# Patient Record
Sex: Female | Born: 1975 | Race: Black or African American | Hispanic: No | Marital: Single | Smoking: Never smoker
Health system: Southern US, Community
[De-identification: ages and names within clinical notes are randomized; demographics above are authoritative.]

## PROBLEM LIST (undated history)

## (undated) DIAGNOSIS — D649 Anemia, unspecified: Secondary | ICD-10-CM

## (undated) DIAGNOSIS — D509 Iron deficiency anemia, unspecified: Secondary | ICD-10-CM

## (undated) DIAGNOSIS — D510 Vitamin B12 deficiency anemia due to intrinsic factor deficiency: Secondary | ICD-10-CM

## (undated) DIAGNOSIS — N920 Excessive and frequent menstruation with regular cycle: Secondary | ICD-10-CM

## (undated) DIAGNOSIS — I10 Essential (primary) hypertension: Secondary | ICD-10-CM

## (undated) DIAGNOSIS — E785 Hyperlipidemia, unspecified: Secondary | ICD-10-CM

## (undated) HISTORY — DX: Excessive and frequent menstruation with regular cycle: N92.0

## (undated) HISTORY — DX: Hyperlipidemia, unspecified: E78.5

## (undated) HISTORY — DX: Vitamin B12 deficiency anemia due to intrinsic factor deficiency: D51.0

## (undated) HISTORY — DX: Iron deficiency anemia, unspecified: D50.9

## (undated) HISTORY — PX: NO PAST SURGERIES: SHX2092

---

## 2009-05-14 ENCOUNTER — Emergency Department (HOSPITAL_BASED_OUTPATIENT_CLINIC_OR_DEPARTMENT_OTHER): Admission: EM | Admit: 2009-05-14 | Discharge: 2009-05-14 | Payer: Self-pay | Admitting: Emergency Medicine

## 2010-06-30 ENCOUNTER — Inpatient Hospital Stay (HOSPITAL_COMMUNITY): Admission: AD | Admit: 2010-06-30 | Discharge: 2010-06-30 | Payer: Self-pay | Admitting: Obstetrics and Gynecology

## 2010-09-09 ENCOUNTER — Inpatient Hospital Stay (HOSPITAL_COMMUNITY)
Admission: AD | Admit: 2010-09-09 | Discharge: 2010-09-10 | Payer: Self-pay | Source: Home / Self Care | Attending: Obstetrics and Gynecology | Admitting: Obstetrics and Gynecology

## 2010-12-14 LAB — CBC
HCT: 32.7 % — ABNORMAL LOW (ref 36.0–46.0)
HCT: 37.7 % (ref 36.0–46.0)
Hemoglobin: 13.1 g/dL (ref 12.0–15.0)
MCH: 32.8 pg (ref 26.0–34.0)
MCHC: 34.7 g/dL (ref 30.0–36.0)
MCV: 94.3 fL (ref 78.0–100.0)
MCV: 95.8 fL (ref 78.0–100.0)
RDW: 12.8 % (ref 11.5–15.5)
WBC: 9.2 10*3/uL (ref 4.0–10.5)

## 2010-12-16 LAB — URINALYSIS, ROUTINE W REFLEX MICROSCOPIC
Bilirubin Urine: NEGATIVE
Hgb urine dipstick: NEGATIVE
Ketones, ur: 15 mg/dL — AB
Specific Gravity, Urine: >1.03 — ABNORMAL HIGH (ref 1.005–1.030)
pH: 6 (ref 5.0–8.0)

## 2010-12-16 LAB — URINE MICROSCOPIC-ADD ON

## 2013-01-10 ENCOUNTER — Ambulatory Visit (INDEPENDENT_AMBULATORY_CARE_PROVIDER_SITE_OTHER): Payer: Managed Care, Other (non HMO) | Admitting: General Surgery

## 2013-12-12 ENCOUNTER — Ambulatory Visit (INDEPENDENT_AMBULATORY_CARE_PROVIDER_SITE_OTHER): Payer: Managed Care, Other (non HMO) | Admitting: General Surgery

## 2013-12-12 ENCOUNTER — Encounter (INDEPENDENT_AMBULATORY_CARE_PROVIDER_SITE_OTHER): Payer: Self-pay

## 2013-12-12 ENCOUNTER — Encounter (INDEPENDENT_AMBULATORY_CARE_PROVIDER_SITE_OTHER): Payer: Self-pay | Admitting: General Surgery

## 2013-12-12 DIAGNOSIS — M25569 Pain in unspecified knee: Secondary | ICD-10-CM

## 2013-12-12 DIAGNOSIS — E785 Hyperlipidemia, unspecified: Secondary | ICD-10-CM

## 2013-12-12 HISTORY — DX: Hyperlipidemia, unspecified: E78.5

## 2013-12-12 HISTORY — DX: Pain in unspecified knee: M25.569

## 2013-12-12 HISTORY — DX: Morbid (severe) obesity due to excess calories: E66.01

## 2013-12-12 LAB — CBC WITH DIFFERENTIAL/PLATELET
BASOS ABS: 0 10*3/uL (ref 0.0–0.1)
BASOS PCT: 0 % (ref 0–1)
EOS ABS: 0.1 10*3/uL (ref 0.0–0.7)
EOS PCT: 2 % (ref 0–5)
HEMATOCRIT: 37.6 % (ref 36.0–46.0)
HEMOGLOBIN: 12.2 g/dL (ref 12.0–15.0)
Lymphocytes Relative: 34 % (ref 12–46)
Lymphs Abs: 2.1 10*3/uL (ref 0.7–4.0)
MCH: 26.8 pg (ref 26.0–34.0)
MCHC: 32.4 g/dL (ref 30.0–36.0)
MCV: 82.6 fL (ref 78.0–100.0)
MONO ABS: 0.6 10*3/uL (ref 0.1–1.0)
MONOS PCT: 9 % (ref 3–12)
Neutro Abs: 3.4 10*3/uL (ref 1.7–7.7)
Neutrophils Relative %: 55 % (ref 43–77)
Platelets: 249 10*3/uL (ref 150–400)
RBC: 4.55 MIL/uL (ref 3.87–5.11)
RDW: 17.3 % — AB (ref 11.5–15.5)
WBC: 6.2 10*3/uL (ref 4.0–10.5)

## 2013-12-12 NOTE — Progress Notes (Signed)
Subjective:   morbid obesity, dyslipidemia, joint pain  Patient ID: Rose Gonzalez, female   DOB: 1976/09/09, 38 y.o.   MRN: [id number]  HPI Patient is a pleasant 38 year old female here for consideration for surgical treatment for obesity. The patient states that she was normal weight through adolescence. She became pregnant for the first time at age 38 and gained quite a bit of weight with her pregnancy. She was able to lose a fair amount of this but she has since had 2 further pregnancies with her youngest child being 39 years old. With each pregnancy she gained more weight and in recent years has been unable to lose a significant amount of weight despite multiple attempts at supervised diets and exercise programs and use of medications. She has been on phentermine and a guide an exercise program since last fall but despite her best efforts has lost only 7 pounds. She feels that her weight is beginning to take its pole on her health. She has significant lower extremity joint pain with popping and creaking and it limits her ability to exercise. She has dyslipidemia and is on medications for this. She has researched her options carefully and into her initial information seminar. She is interested in sleeve gastrectomy. She feels the bypass is more complex and she does not like the idea of foreign materials or multiple office visits for fills required by the band.  Past Medical History  Diagnosis Date  . Hyperlipidemia   Iron deficiency anemia  History reviewed. No pertinent past surgical history. Current Outpatient Prescriptions  Medication Sig Dispense Refill  . atorvastatin (LIPITOR) 40 MG tablet        No current facility-administered medications for this visit.   No Known Allergies History  Substance Use Topics  . Smoking status: Never Smoker   . Smokeless tobacco: Not on file  . Alcohol Use: No     Review of Systems  Respiratory: Negative.   Cardiovascular: Negative.    Gastrointestinal: Positive for constipation. Negative for nausea, vomiting, abdominal pain, diarrhea and blood in stool.  Genitourinary: Positive for menstrual problem.  Musculoskeletal: Positive for arthralgias.       Objective:   Physical Exam BP 126/80  Pulse 77  Temp(Src) 97.8 F (36.6 C) (Oral)  Resp 16  Ht 5\' 11"  (1.803 m)  Wt 262 lb (118.842 kg)  BMI 36.56 kg/m2 General: Alert, Obese African American female, in no distress Skin: Warm and dry without rash or infection. HEENT: No palpable masses or thyromegaly. Sclera nonicteric. Pupils equal round and reactive. Oropharynx clear. Lymph nodes: No cervical, supraclavicular, or inguinal nodes palpable. Lungs: Breath sounds clear and equal without increased work of breathing Cardiovascular: Regular rate and rhythm without murmur. No JVD or edema. Peripheral pulses intact. Abdomen: Nondistended. Soft and nontender. No masses palpable. No organomegaly. No palpable hernias. Extremities: No edema or joint swelling or deformity. No chronic venous stasis changes. Neurologic: Alert and fully oriented. Gait normal.    Assessment:     38 year old female with progressive morbid obesity unresponsive to multiple attempts at nonsurgical management who presents with a BMI of 36.6 and comorbidities of dyslipidemia and chronic joint pain. I think she would have significant benefit with long-term weight loss. We discussed surgical options available including lap band, gastric bypass and sleeve gastrectomy. She prefers sleeping gastrectomy for the reasons above I believe she would be a good candidate for this. We discussed the procedure in detail including its nature and recovery as well as risks of  anesthetic complications, bleeding, leak and rare risk of death long-term risks of stricture, reflux, nutritional deficiencies, persistent nausea and weight regain.  She desires to proceed we'll go ahead with workup including psychologic and nutrition  evaluation, routine lab work and upper GI series. We'll see her back following these studies.  She was given a complete consent form to review and instructions for the Emmi.    Plan:     Begin workup for sleeve gastrectomy as above

## 2013-12-13 LAB — COMPREHENSIVE METABOLIC PANEL WITH GFR
ALT: 17 U/L (ref 0–35)
AST: 19 U/L (ref 0–37)
Albumin: 4.1 g/dL (ref 3.5–5.2)
Alkaline Phosphatase: 39 U/L (ref 39–117)
BUN: 9 mg/dL (ref 6–23)
CO2: 27 meq/L (ref 19–32)
Calcium: 9.3 mg/dL (ref 8.4–10.5)
Chloride: 102 meq/L (ref 96–112)
Creat: 0.86 mg/dL (ref 0.50–1.10)
Glucose, Bld: 89 mg/dL (ref 70–99)
Potassium: 4.2 meq/L (ref 3.5–5.3)
Sodium: 136 meq/L (ref 135–145)
Total Bilirubin: 0.6 mg/dL (ref 0.2–1.2)
Total Protein: 7.3 g/dL (ref 6.0–8.3)

## 2013-12-13 LAB — LIPID PANEL
Cholesterol: 185 mg/dL (ref 0–200)
HDL: 65 mg/dL (ref >39)
LDL Cholesterol: 100 mg/dL — ABNORMAL HIGH (ref 0–99)
Total CHOL/HDL Ratio: 2.8 ratio
Triglycerides: 100 mg/dL (ref <150)
VLDL: 20 mg/dL (ref 0–40)

## 2013-12-13 LAB — H. PYLORI ANTIBODY, IGG: H Pylori IgG: <0.4 {ISR}

## 2013-12-13 LAB — HCG, SERUM, QUALITATIVE: Preg, Serum: NEGATIVE

## 2014-01-03 ENCOUNTER — Other Ambulatory Visit: Payer: Self-pay

## 2014-01-03 ENCOUNTER — Ambulatory Visit (HOSPITAL_COMMUNITY)
Admission: RE | Admit: 2014-01-03 | Discharge: 2014-01-03 | Disposition: A | Discharge status: Home / Self Care | Payer: Managed Care, Other (non HMO) | Source: Ambulatory Visit | Attending: General Surgery | Admitting: General Surgery

## 2014-01-03 DIAGNOSIS — K449 Diaphragmatic hernia without obstruction or gangrene: Secondary | ICD-10-CM | POA: Insufficient documentation

## 2014-01-03 DIAGNOSIS — Z6836 Body mass index (BMI) 36.0-36.9, adult: Secondary | ICD-10-CM | POA: Insufficient documentation

## 2014-01-03 DIAGNOSIS — E785 Hyperlipidemia, unspecified: Secondary | ICD-10-CM

## 2014-01-03 DIAGNOSIS — M25569 Pain in unspecified knee: Secondary | ICD-10-CM | POA: Insufficient documentation

## 2014-01-03 DIAGNOSIS — D509 Iron deficiency anemia, unspecified: Secondary | ICD-10-CM | POA: Insufficient documentation

## 2014-01-28 ENCOUNTER — Ambulatory Visit: Payer: Self-pay | Admitting: *Deleted

## 2014-03-05 ENCOUNTER — Other Ambulatory Visit: Payer: Self-pay

## 2014-03-05 DIAGNOSIS — Z1231 Encounter for screening mammogram for malignant neoplasm of breast: Secondary | ICD-10-CM

## 2014-03-10 ENCOUNTER — Ambulatory Visit
Admission: RE | Admit: 2014-03-10 | Discharge: 2014-03-10 | Disposition: A | Discharge status: Home / Self Care | Payer: Managed Care, Other (non HMO) | Source: Ambulatory Visit

## 2014-03-10 ENCOUNTER — Encounter (INDEPENDENT_AMBULATORY_CARE_PROVIDER_SITE_OTHER): Payer: Self-pay

## 2014-03-10 DIAGNOSIS — Z1231 Encounter for screening mammogram for malignant neoplasm of breast: Secondary | ICD-10-CM

## 2014-03-20 ENCOUNTER — Encounter: Payer: Self-pay | Admitting: Dietician

## 2014-03-20 ENCOUNTER — Encounter: Payer: Managed Care, Other (non HMO) | Attending: General Surgery | Admitting: Dietician

## 2014-03-20 DIAGNOSIS — Z713 Dietary counseling and surveillance: Secondary | ICD-10-CM | POA: Insufficient documentation

## 2014-03-20 NOTE — Progress Notes (Signed)
  Pre-Op Assessment Visit:  Pre-Operative Gastric sleeve Surgery  Medical Nutrition Therapy:  Appt start time: 1145   End time:  1215.  Patient was seen on 03/20/2014 for Pre-Operative Gastric sleeve Nutrition Assessment. [facility] on 03/20/2014.   Preferred Learning Style:   No preference indicated   Learning Readiness:   Ready  Handouts given during visit include:  Pre-Op Goals Bariatric Surgery Protein Shakes  Teaching Method Utilized: Visual Auditory Hands on  Barriers to learning/adherence to lifestyle change: none  Demonstrated degree of understanding via:  Teach Back   Patient to call the [facility] to enroll in Pre-Op and Post-Op Nutrition Education when surgery date is scheduled.

## 2014-05-26 ENCOUNTER — Encounter: Payer: Managed Care, Other (non HMO) | Attending: General Surgery

## 2014-05-26 DIAGNOSIS — Z713 Dietary counseling and surveillance: Secondary | ICD-10-CM | POA: Insufficient documentation

## 2014-05-28 NOTE — Progress Notes (Signed)
  Pre-Operative Nutrition Class:  Appt start time: 1848   End time:  1830.  Patient was seen on 05/26/2014 for Pre-Operative Bariatric Surgery Education at the [facility].   Surgery date: 06/24/2014 Surgery type: Gastric sleeve Start weight at [facility]: 267 lbs on 03/20/2014 Weight today: 274.5 lbs  TANITA  BODY COMP RESULTS  05/26/14   BMI (kg/m^2) 38.3   Fat Mass (lbs) 134   Fat Free Mass (lbs) 140.5   Total Body Water (lbs) 103   Samples given per MNT protocol. Patient educated on appropriate usage: Premier protein shake (vanilla - qty 1) Lot #: [id number] Exp: 07/2014  PB2 (plain - qty 1) Lot #: [id number] Exp: 05/21/2015  Celebrate Vitamins Multivitamin (pineapple strawberry - qty 1) Lot #: [id number] Exp: 01/2015  Renee Pain Protein Powder (unflavored - qty 1) Lot #: [id number] Exp: 08/2015  The following the learning objectives were met by the patient during this course:  Identify Pre-Op Dietary Goals and will begin 2 weeks pre-operatively  Identify appropriate sources of fluids and proteins   State protein recommendations and appropriate sources pre and post-operatively  Identify Post-Operative Dietary Goals and will follow for 2 weeks post-operatively  Identify appropriate multivitamin and calcium sources  Describe the need for physical activity post-operatively and will follow MD recommendations  State when to call healthcare provider regarding medication questions or post-operative complications  Handouts given during class include:  Pre-Op Bariatric Surgery Diet Handout  Protein Shake Handout  Post-Op Bariatric Surgery Nutrition Handout  BELT Program Information Flyer  Support Group Information Flyer  [facility] Bariatric Supplements Price List  Follow-Up Plan: Patient will follow-up at [facility] 2 weeks post operatively for diet advancement per MD.

## 2014-06-10 ENCOUNTER — Encounter (HOSPITAL_COMMUNITY): Payer: Self-pay | Admitting: Pharmacy Technician

## 2014-06-13 ENCOUNTER — Encounter (HOSPITAL_COMMUNITY): Payer: Self-pay | Admitting: Pharmacy Technician

## 2014-06-13 NOTE — Progress Notes (Signed)
Please put orders in Epic surgery is 06-24-14 pre op 06-18-14 Thanks

## 2014-06-16 NOTE — Progress Notes (Signed)
Please put orders in Epic surgery 06-24-14 pre op 06-18-14 Thanks

## 2014-06-17 ENCOUNTER — Other Ambulatory Visit (INDEPENDENT_AMBULATORY_CARE_PROVIDER_SITE_OTHER): Payer: Self-pay | Admitting: General Surgery

## 2014-06-17 NOTE — Progress Notes (Signed)
Need orders in EPIC.  Surgery on 06/24/2014.  Proep on 06/18/14 at 1130am.  Thank You.

## 2014-06-17 NOTE — Patient Instructions (Addendum)
Rose Gonzalez  06/17/2014   Your procedure is scheduled on:  06/24/2014    Report to [facility].  Follow the Signs to Short Stay Center at   0915     am  Call this number if you have problems the morning of surgery: [phone]   Remember:   Do not eat food or drink liquids after midnight.   Take these medicines the morning of surgery with A SIP OF WATER: none    Do not wear jewelry, make-up or nail polish.  Do not wear lotions, powders, or perfumes.  deodorant.  Do not shave 48 hours prior to surgery.   Do not bring valuables to the hospital.  Contacts, dentures or bridgework may not be worn into surgery.  Leave suitcase in the car. After surgery it may be brought to your room.  For patients admitted to the hospital, checkout time is 11:00 AM the day of  discharge.        Please read over the following fact sheets that you were given: [facility] - Preparing for Surgery Before surgery, you can play an important role.  Because skin is not sterile, your skin needs to be as free of germs as possible.  You can reduce the number of germs on your skin by washing with CHG (chlorahexidine gluconate) soap before surgery.  CHG is an antiseptic cleaner which kills germs and bonds with the skin to continue killing germs even after washing. Please DO NOT use if you have an allergy to CHG or antibacterial soaps.  If your skin becomes reddened/irritated stop using the CHG and inform your nurse when you arrive at Short Stay. Do not shave (including legs and underarms) for at least 48 hours prior to the first CHG shower.  You may shave your face/neck. Please follow these instructions carefully:  1.  Shower with CHG Soap the night before surgery and the  morning of Surgery.  2.  If you choose to wash your hair, wash your hair first as usual with your  normal  shampoo.  3.  After you shampoo, rinse your hair and body thoroughly to remove the  shampoo.                           4.  Use CHG as  you would any other liquid soap.  You can apply chg directly  to the skin and wash                       Gently with a scrungie or clean washcloth.  5.  Apply the CHG Soap to your body ONLY FROM THE NECK DOWN.   Do not use on face/ open                           Wound or open sores. Avoid contact with eyes, ears mouth and genitals (private parts).                       Wash face,  Genitals (private parts) with your normal soap.             6.  Wash thoroughly, paying special attention to the area where your surgery  will be performed.  7.  Thoroughly rinse your body with warm water from the neck down.  8.  DO NOT shower/wash with your normal soap  after using and rinsing off  the CHG Soap.                9.  Pat yourself dry with a clean towel.            10.  Wear clean pajamas.            11.  Place clean sheets on your bed the night of your first shower and do not  sleep with pets. Day of Surgery : Do not apply any lotions/deodorants the morning of surgery.  Please wear clean clothes to the hospital/surgery center.  FAILURE TO FOLLOW THESE INSTRUCTIONS MAY RESULT IN THE CANCELLATION OF YOUR SURGERY PATIENT SIGNATURE_________________________________  NURSE SIGNATURE__________________________________  ________________________________________________________________________  coughing and deep breathing exercises, leg exercises

## 2014-06-18 ENCOUNTER — Encounter (HOSPITAL_COMMUNITY)
Admission: RE | Admit: 2014-06-18 | Discharge: 2014-06-18 | Disposition: A | Discharge status: Home / Self Care | Payer: Managed Care, Other (non HMO) | Source: Ambulatory Visit | Attending: General Surgery | Admitting: General Surgery

## 2014-06-18 ENCOUNTER — Encounter (HOSPITAL_COMMUNITY): Payer: Self-pay

## 2014-06-18 DIAGNOSIS — Z01812 Encounter for preprocedural laboratory examination: Secondary | ICD-10-CM | POA: Insufficient documentation

## 2014-06-18 DIAGNOSIS — Z6837 Body mass index (BMI) 37.0-37.9, adult: Secondary | ICD-10-CM | POA: Diagnosis not present

## 2014-06-18 HISTORY — DX: Anemia, unspecified: D64.9

## 2014-06-18 HISTORY — DX: Essential (primary) hypertension: I10

## 2014-06-18 LAB — COMPREHENSIVE METABOLIC PANEL
ALBUMIN: 3.4 g/dL — AB (ref 3.5–5.2)
ALT: 17 U/L (ref 0–35)
ANION GAP: 8 (ref 5–15)
AST: 22 U/L (ref 0–37)
Alkaline Phosphatase: 45 U/L (ref 39–117)
BUN: 11 mg/dL (ref 6–23)
CALCIUM: 9.1 mg/dL (ref 8.4–10.5)
CO2: 25 mEq/L (ref 19–32)
CREATININE: 0.93 mg/dL (ref 0.50–1.10)
Chloride: 104 mEq/L (ref 96–112)
GFR calc non Af Amer: 76 mL/min — ABNORMAL LOW (ref >90)
GFR, EST AFRICAN AMERICAN: 89 mL/min — AB (ref >90)
GLUCOSE: 105 mg/dL — AB (ref 70–99)
Potassium: 3.8 mEq/L (ref 3.7–5.3)
Sodium: 137 mEq/L (ref 137–147)
TOTAL PROTEIN: 7.2 g/dL (ref 6.0–8.3)
Total Bilirubin: 0.4 mg/dL (ref 0.3–1.2)

## 2014-06-18 LAB — CBC WITH DIFFERENTIAL/PLATELET
BASOS PCT: 0 % (ref 0–1)
Basophils Absolute: 0 10*3/uL (ref 0.0–0.1)
EOS ABS: 0.1 10*3/uL (ref 0.0–0.7)
EOS PCT: 3 % (ref 0–5)
HCT: 32.6 % — ABNORMAL LOW (ref 36.0–46.0)
HEMOGLOBIN: 10.5 g/dL — AB (ref 12.0–15.0)
LYMPHS ABS: 1.4 10*3/uL (ref 0.7–4.0)
Lymphocytes Relative: 33 % (ref 12–46)
MCH: 27 pg (ref 26.0–34.0)
MCHC: 32.2 g/dL (ref 30.0–36.0)
MCV: 83.8 fL (ref 78.0–100.0)
MONOS PCT: 9 % (ref 3–12)
Monocytes Absolute: 0.4 10*3/uL (ref 0.1–1.0)
NEUTROS PCT: 55 % (ref 43–77)
Neutro Abs: 2.2 10*3/uL (ref 1.7–7.7)
PLATELETS: 243 10*3/uL (ref 150–400)
RBC: 3.89 MIL/uL (ref 3.87–5.11)
RDW: 13.8 % (ref 11.5–15.5)
WBC: 4.1 10*3/uL (ref 4.0–10.5)

## 2014-06-18 LAB — HCG, SERUM, QUALITATIVE: PREG SERUM: NEGATIVE

## 2014-06-18 NOTE — Progress Notes (Signed)
CBC faxed via EPIC to Dr Johna Sheriff.

## 2014-06-20 ENCOUNTER — Other Ambulatory Visit (INDEPENDENT_AMBULATORY_CARE_PROVIDER_SITE_OTHER): Payer: Self-pay | Admitting: General Surgery

## 2014-06-20 NOTE — H&P (Signed)
History of Present Illness Rose Salina T. Ludie Pavlik MD; 06/20/2014 11:06 AM) Patient words: Pre-Op Gastric Sleeve.  The patient is a 38 year old female who presents with obesity. Patient returns for her preop visit prior to planned laparoscopic sleeve gastrectomy forprogressive morbid obesity with comorbidities of dyslipidemia and chronic joint pain. We reviewed her preoperative workup.There were no significant concerns on her lab work or psych and nutrition evaluations.Upper GI series showed a "tiny" hiatal herniaand I told her we would look for thisduring surgery. She has been feeling well with nointercurrent illness or current infection sore other complaints. Other Problems Rose Gonzalez, [place]; 06/20/2014 10:17 AM) High blood pressure  Past Surgical History Rose Gonzalez, [place]; 06/20/2014 10:17 AM) No pertinent past surgical history  Diagnostic Studies History Rose Gonzalez, [place]; 06/20/2014 10:17 AM) Colonoscopy never Mammogram within last year Pap Smear 1-5 years ago  Allergies Rose Gonzalez, [place]; 06/20/2014 10:17 AM) No Known Drug Allergies 06/20/2014  Social History Rose Gonzalez, [place]; 06/20/2014 10:17 AM) Alcohol use Occasional alcohol use. Caffeine use Carbonated beverages, Coffee, Tea. No drug use Tobacco use Never smoker.  Family History Rose Gonzalez, [place]; 06/20/2014 10:17 AM) Depression Mother. Heart Disease Father.  Pregnancy / Birth History Rose Gonzalez, [place]; 06/20/2014 10:17 AM) Age at menarche 9 years. Contraceptive History Depo-provera, Intrauterine device, Oral contraceptives. Gravida 3 Maternal age 1-20 Para 3 Regular periods     Review of Systems Rose Gonzalez [place]; 06/20/2014 10:17 AM) General Not Present- Appetite Loss, Chills, Fatigue, Fever, Night Sweats, Weight Gain and Weight Loss. Skin Not Present- Change in Wart/Mole, Dryness, Hives, Jaundice, New Lesions, Non-Healing Wounds, Rash and Ulcer. HEENT Not Present- Earache, Hearing Loss,  Hoarseness, Nose Bleed, Oral Ulcers, Ringing in the Ears, Seasonal Allergies, Sinus Pain, Sore Throat, Visual Disturbances, Wears glasses/contact lenses and Yellow Eyes. Respiratory Not Present- Bloody sputum, Chronic Cough, Difficulty Breathing, Snoring and Wheezing. Breast Not Present- Breast Mass, Breast Pain, Nipple Discharge and Skin Changes. Cardiovascular Not Present- Chest Pain, Difficulty Breathing Lying Down, Leg Cramps, Palpitations, Rapid Heart Rate, Shortness of Breath and Swelling of Extremities. Gastrointestinal Not Present- Abdominal Pain, Bloating, Bloody Stool, Change in Bowel Habits, Chronic diarrhea, Constipation, Difficulty Swallowing, Excessive gas, Gets full quickly at meals, Hemorrhoids, Indigestion, Nausea, Rectal Pain and Vomiting. Female Genitourinary Not Present- Frequency, Nocturia, Painful Urination, Pelvic Pain and Urgency. Musculoskeletal Not Present- Back Pain, Joint Pain, Joint Stiffness, Muscle Pain, Muscle Weakness and Swelling of Extremities. Neurological Not Present- Decreased Memory, Fainting, Headaches, Numbness, Seizures, Tingling, Tremor, Trouble walking and Weakness. Psychiatric Not Present- Anxiety, Bipolar, Change in Sleep Pattern, Depression, Fearful and Frequent crying. Endocrine Not Present- Cold Intolerance, Excessive Hunger, Hair Changes, Heat Intolerance, Hot flashes and New Diabetes. Hematology Not Present- Easy Bruising, Excessive bleeding, Gland problems, HIV and Persistent Infections.  Vitals Rose Puls MA; 06/20/2014 10:18 AM) 06/20/2014 10:17 AM Weight: 273.2 lb Height: 71in Body Surface Area: 2.49 m Body Mass Index: 38.1 kg/m Temp.: 65F(Temporal)  Pulse: 80 (Regular)  Resp.: 16 (Unlabored)  BP: 128/84 (Sitting, Left Arm, Standard) Pre-Op Gastric Sleeve weight: 273.2 Pre-Op Gastric Sleeve BMI: 38.20    Physical Exam Rose Salina T. [name] MD; 06/20/2014 11:07 AM)  General Mental Status-Alert. General  Appearance-Consistent with stated age. Hydration-Well hydrated. Voice-Normal.  Head and Neck Note: No palpable masses. Sclera nonicteric. No thyromegaly.   Chest and Lung Exam Note: Clear equal breath sounds bilaterally without wheezing or increased work of breathing   Cardiovascular Note: regular rate and rhythm. No murmurs. No edema.   Abdomen Note: Soft and nontender. No  masses or organomegaly or hernias.   Musculoskeletal Note: No joint swelling or calf tenderness or swelling     Assessment & Plan Rose Salina T. Kysen Wetherington MD; 06/20/2014 11:10 AM)  MORBID OBESITY (278.01  E66.01) Impression: ready to proceed with laparoscopicsleeve gastrectomy.We reviewed the procedure and recovery again in detailand reviewed the consent form and all her questions were answered.She is given a prescription for pain medication.  Current Plans Started OxyCODONE HCl /5ML, 5-10 Milliliter every four hours, as needed, 200 Milliliter, 06/20/2014, No Refill.

## 2014-06-24 ENCOUNTER — Encounter (HOSPITAL_COMMUNITY): Payer: Managed Care, Other (non HMO) | Admitting: Anesthesiology

## 2014-06-24 ENCOUNTER — Encounter (HOSPITAL_COMMUNITY)
Admission: RE | Disposition: A | Discharge status: Home / Self Care | Payer: Self-pay | Source: Ambulatory Visit | Attending: General Surgery

## 2014-06-24 ENCOUNTER — Encounter (HOSPITAL_COMMUNITY): Payer: Self-pay

## 2014-06-24 ENCOUNTER — Inpatient Hospital Stay (HOSPITAL_COMMUNITY): Payer: Managed Care, Other (non HMO) | Admitting: Anesthesiology

## 2014-06-24 ENCOUNTER — Inpatient Hospital Stay (HOSPITAL_COMMUNITY)
Admission: RE | Admit: 2014-06-24 | Discharge: 2014-06-26 | DRG: 621 | Disposition: A | Discharge status: Home / Self Care | Payer: Managed Care, Other (non HMO) | Source: Ambulatory Visit | Attending: General Surgery | Admitting: General Surgery

## 2014-06-24 DIAGNOSIS — Z01812 Encounter for preprocedural laboratory examination: Secondary | ICD-10-CM | POA: Diagnosis not present

## 2014-06-24 DIAGNOSIS — Z8249 Family history of ischemic heart disease and other diseases of the circulatory system: Secondary | ICD-10-CM | POA: Diagnosis not present

## 2014-06-24 DIAGNOSIS — E785 Hyperlipidemia, unspecified: Secondary | ICD-10-CM | POA: Diagnosis present

## 2014-06-24 DIAGNOSIS — Z6838 Body mass index (BMI) 38.0-38.9, adult: Secondary | ICD-10-CM | POA: Diagnosis not present

## 2014-06-24 DIAGNOSIS — G8929 Other chronic pain: Secondary | ICD-10-CM | POA: Diagnosis present

## 2014-06-24 DIAGNOSIS — K449 Diaphragmatic hernia without obstruction or gangrene: Secondary | ICD-10-CM | POA: Diagnosis present

## 2014-06-24 HISTORY — PX: LAPAROSCOPIC GASTRIC SLEEVE RESECTION: SHX5895

## 2014-06-24 LAB — CBC WITH DIFFERENTIAL/PLATELET
BASOS ABS: 0 10*3/uL (ref 0.0–0.1)
BASOS PCT: 0 % (ref 0–1)
Eosinophils Absolute: 0.1 10*3/uL (ref 0.0–0.7)
Eosinophils Relative: 3 % (ref 0–5)
HEMATOCRIT: 31.8 % — AB (ref 36.0–46.0)
Hemoglobin: 10.3 g/dL — ABNORMAL LOW (ref 12.0–15.0)
LYMPHS PCT: 36 % (ref 12–46)
Lymphs Abs: 1.5 10*3/uL (ref 0.7–4.0)
MCH: 27.2 pg (ref 26.0–34.0)
MCHC: 32.4 g/dL (ref 30.0–36.0)
MCV: 83.9 fL (ref 78.0–100.0)
MONO ABS: 0.4 10*3/uL (ref 0.1–1.0)
Monocytes Relative: 9 % (ref 3–12)
NEUTROS ABS: 2.2 10*3/uL (ref 1.7–7.7)
NEUTROS PCT: 52 % (ref 43–77)
PLATELETS: 195 10*3/uL (ref 150–400)
RBC: 3.79 MIL/uL — ABNORMAL LOW (ref 3.87–5.11)
RDW: 14.1 % (ref 11.5–15.5)
WBC: 4.2 10*3/uL (ref 4.0–10.5)

## 2014-06-24 LAB — COMPREHENSIVE METABOLIC PANEL
ALT: 14 U/L (ref 0–35)
AST: 19 U/L (ref 0–37)
Albumin: 3.4 g/dL — ABNORMAL LOW (ref 3.5–5.2)
Alkaline Phosphatase: 40 U/L (ref 39–117)
Anion gap: 9 (ref 5–15)
BILIRUBIN TOTAL: 0.4 mg/dL (ref 0.3–1.2)
BUN: 8 mg/dL (ref 6–23)
CALCIUM: 8.9 mg/dL (ref 8.4–10.5)
CHLORIDE: 103 meq/L (ref 96–112)
CO2: 26 mEq/L (ref 19–32)
Creatinine, Ser: 0.97 mg/dL (ref 0.50–1.10)
GFR calc Af Amer: 84 mL/min — ABNORMAL LOW (ref >90)
GFR calc non Af Amer: 73 mL/min — ABNORMAL LOW (ref >90)
Glucose, Bld: 97 mg/dL (ref 70–99)
Potassium: 3.8 mEq/L (ref 3.7–5.3)
SODIUM: 138 meq/L (ref 137–147)
Total Protein: 6.8 g/dL (ref 6.0–8.3)

## 2014-06-24 LAB — HEMOGLOBIN AND HEMATOCRIT, BLOOD
HCT: 33.5 % — ABNORMAL LOW (ref 36.0–46.0)
Hemoglobin: 10.6 g/dL — ABNORMAL LOW (ref 12.0–15.0)

## 2014-06-24 LAB — PREGNANCY, URINE: Preg Test, Ur: NEGATIVE

## 2014-06-24 SURGERY — GASTRECTOMY, SLEEVE, LAPAROSCOPIC
Anesthesia: General | Site: Abdomen

## 2014-06-24 MED ORDER — CEFOXITIN SODIUM 2 G IV SOLR: 2.0000 g | INTRAVENOUS | Status: DC

## 2014-06-24 MED ORDER — PROPOFOL 10 MG/ML IV BOLUS
INTRAVENOUS | Status: DC | PRN
  Administered 2014-06-24: 150 mg via INTRAVENOUS

## 2014-06-24 MED ORDER — TISSEEL VH 10 ML EX KIT
PACK | CUTANEOUS | Status: DC | PRN
  Administered 2014-06-24: 20 mL

## 2014-06-24 MED ORDER — TISSEEL VH 10 ML EX KIT
PACK | CUTANEOUS | Status: AC
  Filled 2014-06-24: qty 2

## 2014-06-24 MED ORDER — DEXTROSE 5 % IV SOLN
INTRAVENOUS | Status: AC
  Filled 2014-06-24: qty 2

## 2014-06-24 MED ORDER — BUPIVACAINE-EPINEPHRINE 0.25% -1:200000 IJ SOLN
INTRAMUSCULAR | Status: DC | PRN
  Administered 2014-06-24: 33 mL

## 2014-06-24 MED ORDER — DEXTROSE 5 % IV SOLN
2.0000 g | INTRAVENOUS | Status: AC
  Administered 2014-06-24: 2 g via INTRAVENOUS

## 2014-06-24 MED ORDER — FENTANYL CITRATE 0.05 MG/ML IJ SOLN
INTRAMUSCULAR | Status: AC
  Filled 2014-06-24: qty 5

## 2014-06-24 MED ORDER — KETAMINE HCL 10 MG/ML IJ SOLN
INTRAMUSCULAR | Status: DC | PRN
  Administered 2014-06-24: 30 mg via INTRAVENOUS

## 2014-06-24 MED ORDER — DEXMEDETOMIDINE HCL 200 MCG/2ML IV SOLN
INTRAVENOUS | Status: DC | PRN
  Administered 2014-06-24: 8 ug via INTRAVENOUS
  Administered 2014-06-24: 10 ug via INTRAVENOUS
  Administered 2014-06-24: 6 ug via INTRAVENOUS
  Administered 2014-06-24: 8 ug via INTRAVENOUS
  Administered 2014-06-24: 10 ug via INTRAVENOUS

## 2014-06-24 MED ORDER — HEPARIN SODIUM (PORCINE) 5000 UNIT/ML IJ SOLN
5000.0000 [IU] | INTRAMUSCULAR | Status: DC
Start: 2014-06-24 — End: 2014-06-24

## 2014-06-24 MED ORDER — ONDANSETRON HCL 4 MG/2ML IJ SOLN
4.0000 mg | INTRAMUSCULAR | Status: DC | PRN
  Administered 2014-06-24 (×2): 4 mg via INTRAVENOUS
  Filled 2014-06-24 (×2): qty 2

## 2014-06-24 MED ORDER — DEXAMETHASONE SODIUM PHOSPHATE 10 MG/ML IJ SOLN
INTRAMUSCULAR | Status: AC
  Filled 2014-06-24: qty 1

## 2014-06-24 MED ORDER — HEPARIN SODIUM (PORCINE) 5000 UNIT/ML IJ SOLN
5000.0000 [IU] | Freq: Three times a day (TID) | INTRAMUSCULAR | Status: DC
Start: 2014-06-24 — End: 2014-06-26
  Administered 2014-06-24 – 2014-06-26 (×5): 5000 [IU] via SUBCUTANEOUS
  Filled 2014-06-24 (×8): qty 1

## 2014-06-24 MED ORDER — NEOSTIGMINE METHYLSULFATE 10 MG/10ML IV SOLN
INTRAVENOUS | Status: DC | PRN
  Administered 2014-06-24: 4 mg via INTRAVENOUS

## 2014-06-24 MED ORDER — DEXAMETHASONE SODIUM PHOSPHATE 10 MG/ML IJ SOLN
INTRAMUSCULAR | Status: DC | PRN
  Administered 2014-06-24: 10 mg via INTRAVENOUS

## 2014-06-24 MED ORDER — ACETAMINOPHEN 160 MG/5ML PO SOLN: 325.0000 mg | ORAL | Status: DC | PRN

## 2014-06-24 MED ORDER — CHLORHEXIDINE GLUCONATE CLOTH 2 % EX PADS: 6.0000 | MEDICATED_PAD | Freq: Once | CUTANEOUS | Status: DC

## 2014-06-24 MED ORDER — LIDOCAINE HCL (CARDIAC) 20 MG/ML IV SOLN
INTRAVENOUS | Status: AC
  Filled 2014-06-24: qty 5

## 2014-06-24 MED ORDER — FENTANYL CITRATE 0.05 MG/ML IJ SOLN
INTRAMUSCULAR | Status: DC | PRN
  Administered 2014-06-24: 25 ug via INTRAVENOUS
  Administered 2014-06-24: 50 ug via INTRAVENOUS
  Administered 2014-06-24: 25 ug via INTRAVENOUS
  Administered 2014-06-24: 50 ug via INTRAVENOUS
  Administered 2014-06-24 (×2): 25 ug via INTRAVENOUS
  Administered 2014-06-24: 50 ug via INTRAVENOUS

## 2014-06-24 MED ORDER — GLYCOPYRROLATE 0.2 MG/ML IJ SOLN
INTRAMUSCULAR | Status: DC | PRN
  Administered 2014-06-24: 0.6 mg via INTRAVENOUS

## 2014-06-24 MED ORDER — GLYCOPYRROLATE 0.2 MG/ML IJ SOLN
INTRAMUSCULAR | Status: AC
  Filled 2014-06-24: qty 3

## 2014-06-24 MED ORDER — FENTANYL CITRATE 0.05 MG/ML IJ SOLN: 25.0000 ug | INTRAMUSCULAR | Status: DC | PRN

## 2014-06-24 MED ORDER — KCL IN DEXTROSE-NACL 20-5-0.9 MEQ/L-%-% IV SOLN
INTRAVENOUS | Status: DC
  Administered 2014-06-24 – 2014-06-26 (×4): via INTRAVENOUS
  Filled 2014-06-24 (×6): qty 1000

## 2014-06-24 MED ORDER — MIDAZOLAM HCL 2 MG/2ML IJ SOLN
INTRAMUSCULAR | Status: AC
  Filled 2014-06-24: qty 2

## 2014-06-24 MED ORDER — 0.9 % SODIUM CHLORIDE (POUR BTL) OPTIME
TOPICAL | Status: DC | PRN
  Administered 2014-06-24: 1000 mL

## 2014-06-24 MED ORDER — PROMETHAZINE HCL 25 MG/ML IJ SOLN: 6.2500 mg | INTRAMUSCULAR | Status: DC | PRN

## 2014-06-24 MED ORDER — MORPHINE SULFATE 2 MG/ML IJ SOLN
2.0000 mg | INTRAMUSCULAR | Status: DC | PRN
  Administered 2014-06-24: 2 mg via INTRAVENOUS
  Filled 2014-06-24: qty 1

## 2014-06-24 MED ORDER — ACETAMINOPHEN 160 MG/5ML PO SOLN: 650.0000 mg | ORAL | Status: DC | PRN

## 2014-06-24 MED ORDER — UNJURY CHOCOLATE CLASSIC POWDER
2.0000 [oz_av] | Freq: Four times a day (QID) | ORAL | Status: DC
Start: 2014-06-26 — End: 2014-06-26

## 2014-06-24 MED ORDER — LACTATED RINGERS IR SOLN
Status: DC | PRN
  Administered 2014-06-24: 1000 mL

## 2014-06-24 MED ORDER — INFLUENZA VAC SPLIT QUAD 0.5 ML IM SUSY: 0.5000 mL | PREFILLED_SYRINGE | INTRAMUSCULAR | Status: DC

## 2014-06-24 MED ORDER — KETAMINE HCL 10 MG/ML IJ SOLN
INTRAMUSCULAR | Status: AC
  Filled 2014-06-24: qty 1

## 2014-06-24 MED ORDER — OXYCODONE HCL 5 MG/5ML PO SOLN: 5.0000 mg | ORAL | Status: DC | PRN

## 2014-06-24 MED ORDER — SUCCINYLCHOLINE CHLORIDE 20 MG/ML IJ SOLN
INTRAMUSCULAR | Status: DC | PRN
  Administered 2014-06-24: 140 mg via INTRAVENOUS

## 2014-06-24 MED ORDER — UNJURY CHICKEN SOUP POWDER
2.0000 [oz_av] | Freq: Four times a day (QID) | ORAL | Status: DC
Start: 2014-06-26 — End: 2014-06-26

## 2014-06-24 MED ORDER — LACTATED RINGERS IV SOLN
INTRAVENOUS | Status: DC
  Administered 2014-06-24 (×2): via INTRAVENOUS

## 2014-06-24 MED ORDER — ONDANSETRON HCL 4 MG/2ML IJ SOLN
INTRAMUSCULAR | Status: DC | PRN
  Administered 2014-06-24: 4 mg via INTRAVENOUS

## 2014-06-24 MED ORDER — EPHEDRINE SULFATE 50 MG/ML IJ SOLN
INTRAMUSCULAR | Status: AC
  Filled 2014-06-24: qty 1

## 2014-06-24 MED ORDER — HEPARIN SODIUM (PORCINE) 5000 UNIT/ML IJ SOLN
5000.0000 [IU] | INTRAMUSCULAR | Status: AC
  Administered 2014-06-24: 5000 [IU] via SUBCUTANEOUS
  Filled 2014-06-24: qty 1

## 2014-06-24 MED ORDER — CISATRACURIUM BESYLATE 20 MG/10ML IV SOLN
INTRAVENOUS | Status: AC
  Filled 2014-06-24: qty 10

## 2014-06-24 MED ORDER — PROPOFOL 10 MG/ML IV BOLUS
INTRAVENOUS | Status: AC
  Filled 2014-06-24: qty 20

## 2014-06-24 MED ORDER — SODIUM CHLORIDE 0.9 % IJ SOLN
INTRAMUSCULAR | Status: AC
  Filled 2014-06-24: qty 10

## 2014-06-24 MED ORDER — BUPIVACAINE-EPINEPHRINE 0.25% -1:200000 IJ SOLN
INTRAMUSCULAR | Status: AC
  Filled 2014-06-24: qty 1

## 2014-06-24 MED ORDER — UNJURY VANILLA POWDER
2.0000 [oz_av] | Freq: Four times a day (QID) | ORAL | Status: DC
  Administered 2014-06-26: 2 [oz_av] via ORAL

## 2014-06-24 MED ORDER — LIDOCAINE HCL (CARDIAC) 20 MG/ML IV SOLN
INTRAVENOUS | Status: DC | PRN
Start: 2014-06-24 — End: 2014-06-24
  Administered 2014-06-24: 100 mg via INTRAVENOUS

## 2014-06-24 MED ORDER — CISATRACURIUM BESYLATE (PF) 10 MG/5ML IV SOLN
INTRAVENOUS | Status: DC | PRN
  Administered 2014-06-24: 6 mg via INTRAVENOUS
  Administered 2014-06-24: 4 mg via INTRAVENOUS

## 2014-06-24 MED ORDER — ACETAMINOPHEN 10 MG/ML IV SOLN
1000.0000 mg | Freq: Once | INTRAVENOUS | Status: AC
  Administered 2014-06-24: 1000 mg via INTRAVENOUS
  Filled 2014-06-24: qty 100

## 2014-06-24 MED ORDER — CHLORHEXIDINE GLUCONATE CLOTH 2 % EX PADS
6.0000 | MEDICATED_PAD | Freq: Once | CUTANEOUS | Status: DC
Start: 2014-06-24 — End: 2014-06-24

## 2014-06-24 MED ORDER — SCOPOLAMINE 1 MG/3DAYS TD PT72
1.0000 | MEDICATED_PATCH | TRANSDERMAL | Status: DC
  Administered 2014-06-24: 1.5 mg via TRANSDERMAL
  Administered 2014-06-24: 1 via TRANSDERMAL

## 2014-06-24 MED ORDER — EPHEDRINE SULFATE 50 MG/ML IJ SOLN
INTRAMUSCULAR | Status: DC | PRN
  Administered 2014-06-24 (×2): 10 mg via INTRAVENOUS

## 2014-06-24 MED ORDER — MIDAZOLAM HCL 5 MG/5ML IJ SOLN
INTRAMUSCULAR | Status: DC | PRN
  Administered 2014-06-24: 2 mg via INTRAVENOUS

## 2014-06-24 MED ORDER — MEPERIDINE HCL 50 MG/ML IJ SOLN: 6.2500 mg | INTRAMUSCULAR | Status: DC | PRN

## 2014-06-24 MED ORDER — SCOPOLAMINE 1 MG/3DAYS TD PT72
MEDICATED_PATCH | TRANSDERMAL | Status: AC
  Filled 2014-06-24: qty 1

## 2014-06-24 MED ORDER — ONDANSETRON HCL 4 MG/2ML IJ SOLN
INTRAMUSCULAR | Status: AC
  Filled 2014-06-24: qty 2

## 2014-06-24 SURGICAL SUPPLY — 67 items
ADH SKN CLS APL DERMABOND .7 (GAUZE/BANDAGES/DRESSINGS) ×1
APL SRG 32X5 SNPLK LF DISP (MISCELLANEOUS) ×1
APPLICATOR COTTON TIP 6IN STRL (MISCELLANEOUS) IMPLANT
APPLIER CLIP ROT 10 11.4 M/L (STAPLE)
APPLIER CLIP ROT 13.4 12 LRG (CLIP)
APR CLP LRG 13.4X12 ROT 20 MLT (CLIP)
APR CLP MED LRG 11.4X10 (STAPLE)
BAG SPEC RTRVL LRG 6X4 10 (ENDOMECHANICALS)
BLADE SURG SZ11 CARB STEEL (BLADE) ×2 IMPLANT
CABLE HIGH FREQUENCY MONO STRZ (ELECTRODE) IMPLANT
CANISTER SUCTION 2500CC (MISCELLANEOUS) ×2 IMPLANT
CHLORAPREP W/TINT 26ML (MISCELLANEOUS) ×2 IMPLANT
CLIP APPLIE ROT 10 11.4 M/L (STAPLE) IMPLANT
CLIP APPLIE ROT 13.4 12 LRG (CLIP) IMPLANT
DERMABOND ADVANCED (GAUZE/BANDAGES/DRESSINGS) ×1
DERMABOND ADVANCED .7 DNX12 (GAUZE/BANDAGES/DRESSINGS) ×1 IMPLANT
DEVICE SUT QUICK LOAD TK 5 (STAPLE) ×3 IMPLANT
DEVICE SUT TI-KNOT TK 5X26 (MISCELLANEOUS) ×1 IMPLANT
DEVICE SUTURE ENDOST 10MM (ENDOMECHANICALS) ×1 IMPLANT
DEVICE TROCAR PUNCTURE CLOSURE (ENDOMECHANICALS) ×2 IMPLANT
DRAPE CAMERA CLOSED 9X96 (DRAPES) ×2 IMPLANT
DRAPE UTILITY XL STRL (DRAPES) ×4 IMPLANT
ELECT REM PT RETURN 9FT ADLT (ELECTROSURGICAL) ×2
ELECTRODE REM PT RTRN 9FT ADLT (ELECTROSURGICAL) ×1 IMPLANT
GAUZE SPONGE 4X4 12PLY STRL (GAUZE/BANDAGES/DRESSINGS) IMPLANT
GLOVE BIOGEL PI IND STRL 7.5 (GLOVE) ×1 IMPLANT
GLOVE BIOGEL PI INDICATOR 7.5 (GLOVE) ×1
GLOVE SS BIOGEL STRL SZ 7.5 (GLOVE) ×1 IMPLANT
GLOVE SUPERSENSE BIOGEL SZ 7.5 (GLOVE) ×1
GOWN STRL REUS W/TWL XL LVL3 (GOWN DISPOSABLE) ×8 IMPLANT
HOVERMATT SINGLE USE (MISCELLANEOUS) ×2 IMPLANT
KIT BASIN OR (CUSTOM PROCEDURE TRAY) ×2 IMPLANT
NDL SPNL 22GX3.5 QUINCKE BK (NEEDLE) ×1 IMPLANT
NEEDLE SPNL 22GX3.5 QUINCKE BK (NEEDLE) ×2 IMPLANT
PACK UNIVERSAL I (CUSTOM PROCEDURE TRAY) ×2 IMPLANT
PEN SKIN MARKING BROAD (MISCELLANEOUS) ×2 IMPLANT
POUCH SPECIMEN RETRIEVAL 10MM (ENDOMECHANICALS) IMPLANT
RELOAD BLUE (STAPLE) ×2 IMPLANT
RELOAD STAPLE 60 3.8 GOLD REG (STAPLE) ×1 IMPLANT
RELOAD STAPLE 60 4.1 GRN THCK (STAPLE) ×1 IMPLANT
RELOAD STAPLER BLUE 60MM (STAPLE) ×1 IMPLANT
RELOAD STAPLER GOLD 60MM (STAPLE) ×1 IMPLANT
RELOAD STAPLER GREEN 60MM (STAPLE) ×1 IMPLANT
SCISSORS LAP 5X35 DISP (ENDOMECHANICALS) IMPLANT
SEALANT SURGICAL APPL DUAL CAN (MISCELLANEOUS) ×2 IMPLANT
SET IRRIG TUBING LAPAROSCOPIC (IRRIGATION / IRRIGATOR) ×2 IMPLANT
SHEARS CURVED HARMONIC AC 45CM (MISCELLANEOUS) ×2 IMPLANT
SLEEVE ADV FIXATION 5X100MM (TROCAR) ×2 IMPLANT
SLEEVE GASTRECTOMY 36FR VISIGI (MISCELLANEOUS) ×2 IMPLANT
SOLUTION ANTI FOG 6CC (MISCELLANEOUS) ×2 IMPLANT
SPONGE LAP 18X18 X RAY DECT (DISPOSABLE) ×2 IMPLANT
STAPLE ECHEON FLEX 60 POW ENDO (STAPLE) ×2 IMPLANT
STAPLER RELOAD BLUE 60MM (STAPLE) ×2
STAPLER RELOAD GOLD 60MM (STAPLE) ×2
STAPLER RELOAD GREEN 60MM (STAPLE) ×2
SUT ETHILON 2 0 PS N (SUTURE) IMPLANT
SUT MNCRL AB 4-0 PS2 18 (SUTURE) ×2 IMPLANT
SUT SURGIDAC NAB ES-9 0 48 120 (SUTURE) ×3 IMPLANT
SYR 20CC LL (SYRINGE) ×2 IMPLANT
TOWEL OR NON WOVEN STRL DISP B (DISPOSABLE) ×2 IMPLANT
TRAY FOLEY CATH 14FRSI W/METER (CATHETERS) ×1 IMPLANT
TROCAR ADV FIXATION 5X100MM (TROCAR) ×2 IMPLANT
TROCAR BLADELESS 15MM (ENDOMECHANICALS) ×2 IMPLANT
TROCAR XCEL NON-BLD 5MMX100MML (ENDOMECHANICALS) ×2 IMPLANT
TUBING CONNECTING 10 (TUBING) ×2 IMPLANT
TUBING ENDO SMARTCAP PENTAX (MISCELLANEOUS) ×2 IMPLANT
TUBING FILTER THERMOFLATOR (ELECTROSURGICAL) ×2 IMPLANT

## 2014-06-24 NOTE — Anesthesia Postprocedure Evaluation (Signed)
  Anesthesia Post-op Note  Patient: Rose Gonzalez  Procedure(s) Performed: Procedure(s): LAPAROSCOPIC GASTRIC SLEEVE RESECTION (N/A)  Patient Location: PACU  Anesthesia Type:General  Level of Consciousness: awake and alert   Airway and Oxygen Therapy: Patient Spontanous Breathing  Post-op Pain: mild  Post-op Assessment: Post-op Vital signs reviewed, Patient's Cardiovascular Status Stable, Respiratory Function Stable, Patent Airway and No signs of Nausea or vomiting  Post-op Vital Signs: Reviewed and stable  Last Vitals:  Filed Vitals:   06/24/14 1330  BP: 135/77  Pulse: 75  Temp:   Resp: 20    Complications: No apparent anesthesia complications

## 2014-06-24 NOTE — Op Note (Signed)
Preoperative Diagnosis: Morbid Obesity  Postoprative Diagnosis: Morbid Obesity  Procedure: Procedure(s): LAPAROSCOPIC GASTRIC SLEEVE RESECTION   Surgeon: Glenna Fellows T   Assistants: Ovidio Kin  Anesthesia:  General endotracheal anesthesia  Indications: Patient is a 38 year old female with progressive morbid obesity unresponsive to medical management. After extensive preoperative workup and discussion detailed elsewhere we have elected to proceed with laparoscopic sleeve gastrectomy for treatment of her morbid obesity.    Procedure Detail:  Patient was brought to the operating room, placed in the superficial operating table, and general endotracheal anesthesia induced. She received preoperative IV antibiotics. PAS replaced. She was given subcutaneous heparin preoperatively. The abdomen was widely sterilely prepped and draped. Patient timeout was performed and correct procedure verified. Access was obtained with a 5 mm Optiview trocar in the left upper quadrant midclavicular line without difficulty and pneumoperitoneum established. Under direct vision a 5 mm trocar was placed laterally in the right upper quadrant, a 15 mm trocar in the right abdomen near the midline at the base of the falciform ligament, a 5 mm trocar just to the left and above the umbilicus for the camera port and an additional 5 mm trocar in the left lateral abdomen. The patient had a small hiatal hernia seen on upper GI series although she is minimal if any reflux. We confirmed that there was actually a moderate sized hiatal hernia present. We initially repaired this. The gastrohepatic omentum was divided exposing the right crus. The anterior border of the right crus was dissected and the retroesophageal space entered. Peritoneum circumferentially around the EG junction was incised and the Distal esophagus completely mobilized and the left crus clearly dissected and identified. The hiatal defect was then closed with 2  interrupted 0 Ethibond sutures closing it down to a normal size. The VisiG gastric tube was placed and the stomach decompressed. The tube was withdrawn back into the esophagus. We measured 5 mm from the pylorus and beginning at this point along the greater curve the greater curve vasculature was divided with the harmonic scalpel and the lesser sac entered. The dissection was continued along the greater curve up toward the short gastric vessels which were divided. The fundus was completely dissected up to the hiatus in the left crus completely exposed joining the dissection were done for the hiatal hernia repair. We went back and further mobilized the greater curve distally a little further to ensure the stapler could be placed 5 cm from the pylorus. There were no posterior attachments of the stomach. The VisiG tube was then again passed distally down to the pylorus and then placed up against the lesser curve and placed on suction with good fixation of the lesser curve. An initial firing of the green load echelon 60 mm stapler was performed 5 mm from the pylorus angling up to the left of the incisura. A second firing of the gold echelon 60 mm stapler was performed going past the incisura but staying away from the tube at this point. Following this several firings of the blue 60 mm echelon stapler were performed next to the tube but not tight against it up through the angle of Hiss with the last firing going lateral to the fat pad completing the sleeve gastrectomy. The staple line was intact without scalloping or twisting or narrowing. No bleeding. The sleeve was insufflated using the VisiG tube There was no evidence of leak under saline irrigation. The tube was withdrawn. Dr. Ezzard Standing performed upper endoscopy without bleeding or narrowing and no evidence of  leak. The sleeve was desufflated. The staple line was coated with Tisseel tissue sealant. The gastrectomy specimen was removed through the 15 mm trocar site after  dilating it slightly and the fascia at this site was closed with a single 0 Vicryl suture with the Endo Close. Skin incisions were closed with subcutaneous Monocryl and Dermabond. Sponge needle and instrument counts were correct.    Findings: As above  Estimated Blood Loss:  Minimal         Drains: none  Blood Given: none   Specimen: Greater curvature of stomach                Complications:  * No complications entered in OR log *         Disposition: PACU - hemodynamically stable.         Condition: stable

## 2014-06-24 NOTE — Anesthesia Preprocedure Evaluation (Addendum)
Anesthesia Evaluation  Patient identified by MRN, date of birth, ID band Patient awake    Reviewed: Allergy & Precautions, H&P , Patient's Chart, lab work & pertinent test results  Airway       Dental   Pulmonary          Cardiovascular hypertension,     Neuro/Psych    GI/Hepatic   Endo/Other    Renal/GU      Musculoskeletal   Abdominal   Peds  Hematology  (+) anemia , H/H 10/32 chronic   Anesthesia Other Findings   Reproductive/Obstetrics PG neg 6 days ago                          Anesthesia Physical Anesthesia Plan  ASA: III  Anesthesia Plan: General   Post-op Pain Management:    Induction: Intravenous  Airway Management Planned: Oral ETT  Additional Equipment:   Intra-op Plan:   Post-operative Plan: Extubation in OR  Informed Consent: I have reviewed the patients History and Physical, chart, labs and discussed the procedure including the risks, benefits and alternatives for the proposed anesthesia with the patient or authorized representative who has indicated his/her understanding and acceptance.     Plan Discussed with:   Anesthesia Plan Comments: (H/H 10/32 chronic, multinodal pain rx, (tylenol, precedex, toradol, decadron, lidocaine, ketamine?))        Anesthesia Quick Evaluation

## 2014-06-24 NOTE — Op Note (Signed)
Name:  [name] MRN: [id number] Date of Surgery: 06/24/2014  Preop Diagnosis:  Morbid Obesity  Postop Diagnosis:  Morbid Obesity (Weight 262, BMI - 36.56), S/P Gastric Sleeve  Procedure:  Upper endoscopy  (Intraoperative)  Surgeon:  Ovidio Kin, M.D.  Anesthesia:  GET  Indications for procedure: Rose Gonzalez is a 38 y.o. female whose primary care physician is Lorenda Ishihara and has completed a Gastric Sleeve today by Dr. Johna Sheriff.  I am doing an intraoperative upper endoscopy to evaluate the gastric pouch.  Operative Note: The patient is under general anesthesia.  Dr. Johna Sheriff is laparoscoping the patient while I do an upper endoscopy to evaluate the stomach pouch.  With the patient intubated, I passed the Pentax upper endoscope without difficulty down the esophagus.  The esophago-gastric junction was at 39 cm.    The mucosa of the stomach looked viable and the staple line was intact without bleeding.  I advanced to the pylorus, but did not go through it.  While I insufflated the stomach pouch with air, Dr. Johna Sheriff  flooded the upper abdomen with saline to put the gastric pouch under saline.  There was no bubbling or evidence of a leak.  Photos were taken of the gastric pouch.  There was no evidence of narrowing of the pouch and the gastric sleeve looked tubular.  The scope was then withdrawn.  The esophagus was unremarkable and the patient tolerated the endoscopy without difficulty.  Ovidio Kin, MD, [facility] Pager: [phone] Office phone:  [phone]

## 2014-06-24 NOTE — Transfer of Care (Signed)
Immediate Anesthesia Transfer of Care Note  Patient: Rose Gonzalez  Procedure(s) Performed: Procedure(s): LAPAROSCOPIC GASTRIC SLEEVE RESECTION (N/A)  Patient Location: PACU  Anesthesia Type:General  Level of Consciousness: awake and patient cooperative  Airway & Oxygen Therapy: Patient Spontanous Breathing and Patient connected to face mask oxygen  Post-op Assessment: Report given to PACU RN and Post -op Vital signs reviewed and stable  Post vital signs: Reviewed and stable  Complications: No apparent anesthesia complications

## 2014-06-24 NOTE — Interval H&P Note (Signed)
History and Physical Interval Note:  06/24/2014 10:38 AM  Rose Gonzalez [name]  has presented today for surgery, with the diagnosis of Morbid Obesity  The various methods of treatment have been discussed with the patient and family. After consideration of risks, benefits and other options for treatment, the patient has consented to  Procedure(s): LAPAROSCOPIC GASTRIC SLEEVE RESECTION (N/A) as a surgical intervention .  The patient's history has been reviewed, patient examined, no change in status, stable for surgery.  I have reviewed the patient's chart and labs.  Questions were answered to the patient's satisfaction.     Rohini Jaroszewski T

## 2014-06-25 ENCOUNTER — Encounter (HOSPITAL_COMMUNITY): Payer: Self-pay | Admitting: General Surgery

## 2014-06-25 ENCOUNTER — Inpatient Hospital Stay (HOSPITAL_COMMUNITY): Payer: Managed Care, Other (non HMO)

## 2014-06-25 LAB — CBC WITH DIFFERENTIAL/PLATELET
BASOS ABS: 0 10*3/uL (ref 0.0–0.1)
BASOS PCT: 0 % (ref 0–1)
EOS PCT: 0 % (ref 0–5)
Eosinophils Absolute: 0 10*3/uL (ref 0.0–0.7)
HEMATOCRIT: 34.1 % — AB (ref 36.0–46.0)
Hemoglobin: 10.9 g/dL — ABNORMAL LOW (ref 12.0–15.0)
Lymphocytes Relative: 14 % (ref 12–46)
Lymphs Abs: 1.1 10*3/uL (ref 0.7–4.0)
MCH: 26.9 pg (ref 26.0–34.0)
MCHC: 32 g/dL (ref 30.0–36.0)
MCV: 84.2 fL (ref 78.0–100.0)
MONO ABS: 0.8 10*3/uL (ref 0.1–1.0)
Monocytes Relative: 9 % (ref 3–12)
Neutro Abs: 6.2 10*3/uL (ref 1.7–7.7)
Neutrophils Relative %: 77 % (ref 43–77)
PLATELETS: 197 10*3/uL (ref 150–400)
RBC: 4.05 MIL/uL (ref 3.87–5.11)
RDW: 14.2 % (ref 11.5–15.5)
WBC: 8.1 10*3/uL (ref 4.0–10.5)

## 2014-06-25 LAB — HEMOGLOBIN AND HEMATOCRIT, BLOOD
HCT: 29.9 % — ABNORMAL LOW (ref 36.0–46.0)
Hemoglobin: 9.6 g/dL — ABNORMAL LOW (ref 12.0–15.0)

## 2014-06-25 MED ORDER — PROMETHAZINE HCL 25 MG/ML IJ SOLN: 12.5000 mg | Freq: Four times a day (QID) | INTRAMUSCULAR | Status: DC | PRN

## 2014-06-25 MED ORDER — IOHEXOL 300 MG/ML  SOLN
50.0000 mL | Freq: Once | INTRAMUSCULAR | Status: AC | PRN
  Administered 2014-06-25: 50 mL via ORAL

## 2014-06-25 MED ORDER — PROMETHAZINE HCL 25 MG PO TABS: 12.5000 mg | ORAL_TABLET | Freq: Four times a day (QID) | ORAL | Status: DC | PRN

## 2014-06-25 NOTE — Progress Notes (Signed)
Patient ID: Rose Gonzalez, female   DOB: 1976/05/30, 38 y.o.   MRN: [id number] 1 Day Post-Op  Subjective: Minimal pain.  Nauseated without vomiting. Has been walking  Objective: Vital signs in last 24 hours: Temp:  [97.9 F (36.6 C)-99.5 F (37.5 C)] 99.5 F (37.5 C) (09/23 0600) Pulse Rate:  [56-79] 58 (09/23 0600) Resp:  [15-20] 18 (09/23 0600) BP: (116-141)/(58-87) 141/72 mmHg (09/23 0600) SpO2:  [98 %-100 %] 98 % (09/23 0600) Weight:  [271 lb 8 oz (123.152 kg)] 271 lb 8 oz (123.152 kg) (09/22 0955) Last BM Date: 06/22/14  Intake/Output from previous day: 09/22 0701 - 09/23 0700 In: 1945.8 [I.V.:1945.8] Out: 2200 [Urine:2150; Blood:50] Intake/Output this shift:    General appearance: alert, cooperative and no distress GI: normal findings: soft, non-tender Incision/Wound: Clean and dry  Lab Results:   Recent Labs  06/24/14 1015 06/24/14 1900 06/25/14 0425  WBC 4.2  --  8.1  HGB 10.3* 10.6* 10.9*  HCT 31.8* 33.5* 34.1*  PLT 195  --  197   BMET  Recent Labs  06/24/14 1015  NA 138  K 3.8  CL 103  CO2 26  GLUCOSE 97  BUN 8  CREATININE 0.97  CALCIUM 8.9     Studies/Results: No results found.  Anti-infectives: Anti-infectives   Start     Dose/Rate Route Frequency Ordered Stop   06/24/14 0952  cefOXitin (MEFOXIN) 2 g in dextrose 5 % 50 mL IVPB  Status:  Discontinued     2 g 100 mL/hr over 30 Minutes Intravenous On call to O.R. 06/24/14 4098 06/24/14 0954   06/24/14 0949  cefOXitin (MEFOXIN) 2 g in dextrose 5 % 50 mL IVPB     2 g 100 mL/hr over 30 Minutes Intravenous On call to O.R. 06/24/14 0949 06/24/14 1132      Assessment/Plan: s/p Procedure(s): LAPAROSCOPIC GASTRIC SLEEVE RESECTION Doing well but nauseated.  Add phenergan prn. For gastrograffin swallow   LOS: 1 day    Jaycee Mckellips T 06/25/2014

## 2014-06-25 NOTE — Progress Notes (Signed)
Nutrition Education Note  Received consult for diet education per DROP protocol.   Discussed 2 week post op diet with pt. Emphasized that liquids must be non carbonated, non caffeinated, and sugar free. Fluid goals discussed. Pt to follow up with outpatient bariatric RD for further diet progression after 2 weeks. Multivitamins and minerals also reviewed. Teach back method used, pt expressed understanding, expect good compliance. Pt c/o nausea, MD added Phenergan.    Diet: First 2 Weeks  You will see the nutritionist about two (2) weeks after your surgery. The nutritionist will increase the types of foods you can eat if you are handling liquids well:  If you have severe vomiting or nausea and cannot handle clear liquids lasting longer than 1 day, call your surgeon  Protein Shake  Drink at least 2 ounces of shake 5-6 times per day  Each serving of protein shakes (usually 8 - 12 ounces) should have a minimum of:  15 grams of protein  And no more than 5 grams of carbohydrate  Goal for protein each day:  Men = 80 grams per day  Women = 60 grams per day  Protein powder may be added to fluids such as non-fat milk or Lactaid milk or Soy milk (limit to 35 grams added protein powder per serving)   Hydration  Slowly increase the amount of water and other clear liquids as tolerated (See Acceptable Fluids)  Slowly increase the amount of protein shake as tolerated  Sip fluids slowly and throughout the day  May use sugar substitutes in small amounts (no more than 6 - 8 packets per day; i.e. Splenda)   Fluid Goal  The first goal is to drink at least 8 ounces of protein shake/drink per day (or as directed by the nutritionist); some examples of protein shakes are [employer], [employer], EAS Edge HP, and Unjury. See handout from pre-op Bariatric Education Class:  Slowly increase the amount of protein shake you drink as tolerated  You may find it easier to slowly sip shakes throughout the day  It  is important to get your proteins in first  Your fluid goal is to drink 64 - 100 ounces of fluid daily  It may take a few weeks to build up to this  32 oz (or more) should be clear liquids  And  32 oz (or more) should be full liquids (see below for examples)  Liquids should not contain sugar, caffeine, or carbonation   Clear Liquids:  Water or Sugar-free flavored water (i.e. Fruit H2O, Propel)  Decaffeinated coffee or tea (sugar-free)  Crystal Lite, Wyler's Lite, Minute Maid Lite  Sugar-free Jell-O  Bouillon or broth  Sugar-free Popsicle: *Less than 20 calories each; Limit 1 per day   Full Liquids:  Protein Shakes/Drinks + 2 choices per day of other full liquids  Full liquids must be:  No More Than 12 grams of Carbs per serving  No More Than 3 grams of Fat per serving  Strained low-fat cream soup  Non-Fat milk  Fat-free Lactaid Milk  Sugar-free yogurt (Dannon Lite & Fit, Greek yogurt)     [place] MS, RD, [place] [phone] Pager [phone] Weekend/After Hours Pager

## 2014-06-25 NOTE — Care Management Note (Signed)
    Page 1 of 1   06/25/2014     11:40:11 AM CARE MANAGEMENT NOTE 06/25/2014  Patient:  YNEZ, EUGENIO   Account Number:  [id number]  Date Initiated:  06/25/2014  Documentation initiated by:  Lorenda Ishihara  Subjective/Objective Assessment:   38 yo female admitted s/p sleeve gastrectomy. PTA lived at home.     Action/Plan:   Home when stable   Anticipated DC Date:  06/27/2014   Anticipated DC Plan:  HOME/SELF CARE      DC Planning Services  CM consult      Choice offered to / List presented to:             Status of service:  Completed, signed off Medicare Important Message given?   (If response is "NO", the following Medicare IM given date fields will be blank) Date Medicare IM given:   Medicare IM given by:   Date Additional Medicare IM given:   Additional Medicare IM given by:    Discharge Disposition:  HOME/SELF CARE  Per UR Regulation:  Reviewed for med. necessity/level of care/duration of stay  If discussed at Long Length of Stay Meetings, dates discussed:    Comments:

## 2014-06-26 LAB — CBC WITH DIFFERENTIAL/PLATELET
BASOS ABS: 0 10*3/uL (ref 0.0–0.1)
Basophils Relative: 0 % (ref 0–1)
EOS ABS: 0 10*3/uL (ref 0.0–0.7)
EOS PCT: 0 % (ref 0–5)
HEMATOCRIT: 30.6 % — AB (ref 36.0–46.0)
Hemoglobin: 9.9 g/dL — ABNORMAL LOW (ref 12.0–15.0)
LYMPHS ABS: 1.6 10*3/uL (ref 0.7–4.0)
Lymphocytes Relative: 25 % (ref 12–46)
MCH: 27.1 pg (ref 26.0–34.0)
MCHC: 32.4 g/dL (ref 30.0–36.0)
MCV: 83.8 fL (ref 78.0–100.0)
Monocytes Absolute: 0.6 10*3/uL (ref 0.1–1.0)
Monocytes Relative: 10 % (ref 3–12)
Neutro Abs: 4.1 10*3/uL (ref 1.7–7.7)
Neutrophils Relative %: 65 % (ref 43–77)
PLATELETS: 196 10*3/uL (ref 150–400)
RBC: 3.65 MIL/uL — ABNORMAL LOW (ref 3.87–5.11)
RDW: 14.4 % (ref 11.5–15.5)
WBC: 6.3 10*3/uL (ref 4.0–10.5)

## 2014-06-26 NOTE — Discharge Summary (Signed)
   Patient ID: WESTON FULCO [id number] 38 y.o. 1976-03-27  06/24/2014  Discharge date and time: 06/26/2014   Admitting Physician: Glenna Fellows T  Discharge Physician: Glenna Fellows T  Admission Diagnoses: Morbid Obesity  Discharge Diagnoses: Same  Operations: Procedure(s): LAPAROSCOPIC GASTRIC SLEEVE RESECTION  Admission Condition: good  Discharged Condition: good  Indication for Admission: Patient is a 38 year old female with morbid obesity unresponsive to medical management. After extensive preoperative workup and discussion detailed elsewhere we elected to proceed with laparoscopic sleeve gastrectomy for treatment and she is admitted following this procedure.  Hospital Course: Patient underwent an uneventful laparoscopic sleeve gastrectomy the morning of admission. Postoperatively she had some significant nausea for the first 24 hours without vomiting. Vital signs and lab work were unremarkable. Gastrografin swallow on the first postoperative day showed no leak or obstruction and she was started on water. Her nausea resolved. On the second postoperative day she was feeling well. Tolerating protein shakes. Abdomen soft and nontender. Incisions healing well. White blood count normal. She is ready for discharge.   Disposition: Home  Patient Instructions:    Medication List         multivitamin with minerals Tabs tablet  Take 1 tablet by mouth every morning.        Activity: activity as tolerated Diet: bariatric shakes Wound Care: none needed  Follow-up:  With Dr. Johna Sheriff in 3 weeks.  Signed: Mariella Saa MD, FACS  06/26/2014, 9:45 AM

## 2014-06-26 NOTE — Discharge Instructions (Signed)
Per bariatric nurse ° ° ° °GASTRIC BYPASS/SLEEVE ° Home Care Instructions ° ° These instructions are to help you care for yourself when you go home. ° °Call: If you have any problems. °• Call [phone] and ask for the surgeon on call °• If you need immediate assistance come to the ER at [facility]. Tell the ER staff you are a new post-op gastric bypass or gastric sleeve patient  °Signs and symptoms to report: • Severe  vomiting or nausea °o If you cannot handle clear liquids for longer than 1 day, call your surgeon °• Abdominal pain which does not get better after taking your pain medication °• Fever greater than 100.4°  F and chills °• Heart rate over 100 beats a minute °• Trouble breathing °• Chest pain °• Redness,  swelling, drainage, or foul odor at incision (surgical) sites °• If your incisions open or pull apart °• Swelling or pain in calf (lower leg) °• Diarrhea (Loose bowel movements that happen often), frequent watery, uncontrolled bowel movements °• Constipation, (no bowel movements for 3 days) if this happens: °o Take Milk of Magnesia, 2 tablespoons by mouth, 3 times a day for 2 days if needed °o Stop taking Milk of Magnesia once you have had a bowel movement °o Call your doctor if constipation continues °Or °o Take Miralax  (instead of Milk of Magnesia) following the label instructions °o Stop taking Miralax once you have had a bowel movement °o Call your doctor if constipation continues °• Anything you think is “abnormal for you” °  °Normal side effects after surgery: • Unable to sleep at night or unable to concentrate °• Irritability °• Being tearful (crying) or depressed ° °These are common complaints, possibly related to your anesthesia, stress of surgery, and change in lifestyle, that usually go away a few weeks after surgery. If these feelings continue, call your medical doctor.  °Wound Care: You may have surgical glue, steri-strips, or staples over your incisions after surgery °• Surgical  glue: Looks like clear film over your incisions and will wear off a little at a time °• Steri-strips: Adhesive strips of tape over your incisions. You may notice a yellowish color on skin under the steri-strips. This is used to make the steri-strips stick better. Do not pull the steri-strips off - let them fall off °• Staples: Staples may be removed before you leave the hospital °o If you go home with staples, call [facility] for an appointment with your surgeon’s nurse to have staples removed 10 days after surgery, [phone] °• Showering: You may shower two (2) days after your surgery unless your surgeon tells you differently °o Wash gently around incisions with warm soapy water, rinse well, and gently pat dry °o If you have a drain (tube from your incision), you may need someone to hold this while you shower °o No tub baths until staples are removed and incisions are healed °  °Medications: • Medications should be liquid or crushed if larger than the size of a dime °• Extended release pills (medication that releases a little bit at a time through the  day) should not be crushed °• Depending on the size and number of medications you take, you may need to space (take a few throughout the day)/change the time you take your medications so that you do not over-fill your pouch (smaller stomach) °• Make sure you follow-up with you primary care physician to make medication changes needed during rapid weight loss and   life -style changes °• If you have diabetes, follow up with your doctor that orders your diabetes medication(s) within one week after surgery and check your blood sugar regularly ° °• Do not drive while taking narcotics (pain medications) ° °• Do not take acetaminophen (Tylenol) and Roxicet or Lortab Elixir at the same time since these pain medications contain acetaminophen °  °Diet:  °First 2 Weeks You will see the nutritionist about two (2) weeks after your surgery. The nutritionist will  increase the types of foods you can eat if you are handling liquids well: °• If you have severe vomiting or nausea and cannot handle clear liquids lasting longer than 1 day call your surgeon °Protein Shake °• Drink at least 2 ounces of shake 5-6 times per day °• Each serving of protein shakes (usually 8-12 ounces) should have a minimum of: °o 15 grams of protein °o And no more than 5 grams of carbohydrate °• Goal for protein each day: °o Men = 80 grams per day °o Women = 60 grams per day °  ° • Protein powder may be added to fluids such as non-fat milk or Lactaid milk or Soy milk (limit to 35 grams added protein powder per serving) ° °Hydration °• Slowly increase the amount of water and other clear liquids as tolerated (See Acceptable Fluids) °• Slowly increase the amount of protein shake as tolerated °• Sip fluids slowly and throughout the day °• May use sugar substitutes in small amounts (no more than 6-8 packets per day; i.e. Splenda) ° °Fluid Goal °• The first goal is to drink at least 8 ounces of protein shake/drink per day (or as directed by the nutritionist); some examples of protein shakes are Syntrax Nectar, Adkins Advantage, EAS Edge HP, and Unjury. - See handout from pre-op Bariatric Education Class: °o Slowly increase the amount of protein shake you drink as tolerated °o You may find it easier to slowly sip shakes throughout the day °o It is important to get your proteins in first °• Your fluid goal is to drink 64-100 ounces of fluid daily °o It may take a few weeks to build up to this  °• 32 oz. (or more) should be clear liquids °And °• 32 oz. (or more) should be full liquids (see below for examples) °• Liquids should not contain sugar, caffeine, or carbonation ° °Clear Liquids: °• Water of Sugar-free flavored water (i.e. Fruit H²O, Propel) °• Decaffeinated coffee or tea (sugar-free) °• Crystal lite, Wyler’s Lite, Minute Maid Lite °• Sugar-free Jell-O °• Bouillon or broth °• Sugar-free Popsicle:    -  Less than 20 calories each; Limit 1 per day ° °Full Liquids: °                  Protein Shakes/Drinks + 2 choices per day of other full liquids °• Full liquids must be: °o No More Than 12 grams of Carbs per serving °o No More Than 3 grams of Fat per serving °• Strained low-fat cream soup °• Non-Fat milk °• Fat-free Lactaid Milk °• Sugar-free yogurt (Dannon Lite & Fit, Greek yogurt) ° °  °Vitamins and Minerals • Start 1 day after surgery unless otherwise directed by your surgeon °• 2 Chewable Multivitamin / Multimineral Supplement with iron (i.e. Centrum for Adults) °• Vitamin B-12, 350-500 micrograms sub-lingual (place tablet under the tongue) each day °• Chewable Calcium Citrate with Vitamin D-3 °(Example: 3 Chewable Calcium  Plus 600 with Vitamin D-3) °o Take 500 mg three (3)   times a day for a total of 1500 mg each day °o Do not take all 3 doses of calcium at one time as it may cause constipation, and you can only absorb 500 mg at a time °o Do not mix multivitamins containing iron with calcium supplements;  take 2 hours apart °o Do not substitute Tums (calcium carbonate) for your calcium °• Menstruating women and those at risk for anemia ( a blood disease that causes weakness) may need extra iron °o Talk to your doctor to see if you need more iron °• If you need extra iron: Total daily Iron recommendation (including Vitamins) is 50 to 100 mg Iron/day °• Do not stop taking or change any vitamins or minerals until you talk to your nutritionist or surgeon °• Your nutritionist and/or surgeon must approve all vitamin and mineral supplements °  °Activity and Exercise: It is important to continue walking at home. Limit your physical activity as instructed by your doctor. During this time, use these guidelines: °• Do not lift anything greater than ten  (10) pounds for at least two (2) weeks °• Do not go back to work or drive until your surgeon says you can °• You may have sex when you feel comfortable °o It is VERY  important for female patients to use a reliable birth control method; fertility often increase after surgery °o Do not get pregnant for at least 18 months °• Start exercising as soon as your doctor tells you that you can °o Make sure your doctor approves any physical activity °• Start with a simple walking program °• Walk 5-15 minutes each day, 7 days per week °• Slowly increase until you are walking 30-45 minutes per day °• Consider joining our BELT program. [phone] or email [email or web address] °  °Special Instructions Things to remember: °• Free counseling is available for you and your family through collaboration between [employer] and [employer]. Please call [phone] and leave a message °• Use your CPAP when sleeping if this applies to you °• Consider buying a medical alert bracelet that says you had lap-band surgery °  °  You will likely have your first fill (fluid added to your band) 6 - 8 weeks after surgery °• [facility] has a free Bariatric Surgery Support Group that meets monthly, the 3rd Thursday, 6pm. [employer] [facility] Classrooms. You can see classes online at [email or web address] °• It is very important to keep all follow up appointments with your surgeon, nutritionist, primary care physician, and behavioral health practitioner °o After the first year, please follow up with your bariatric surgeon and nutritionist at least once a year in order to maintain best weight loss results °      °             [facility]:  [phone] ° °             [facility]: [phone] ° °             Bariatric Nurse Coordinator: [phone]  °Gastric Bypass/Sleeve Home Care Instructions  Rev. 11/2012    ° °                                                    Reviewed and Endorsed °                                                     by [facility] Patient Education Committee, Jan, 2014 ° ° ° ° ° ° ° ° ° °

## 2014-06-26 NOTE — Progress Notes (Signed)
Patient ID: Rose Gonzalez, female   DOB: 1976/02/26, 38 y.o.   MRN: [id number] 2 Days Post-Op  Subjective: No complaints this morning. Nausea resolved. Tolerating water. No significant pain.  Objective: Vital signs in last 24 hours: Temp:  [98.4 F (36.9 C)-99 F (37.2 C)] 98.4 F (36.9 C) (09/24 0558) Pulse Rate:  [57-70] 57 (09/24 0558) Resp:  [18] 18 (09/24 0558) BP: (115-148)/(69-92) 115/70 mmHg (09/24 0558) SpO2:  [99 %-100 %] 100 % (09/24 0558) Last BM Date: 06/23/14  Intake/Output from previous day: 09/23 0701 - 09/24 0700 In: 3720 [P.O.:120; I.V.:3600] Out: 1450 [Urine:1450] Intake/Output this shift:    General appearance: alert, cooperative and no distress GI: normal findings: soft, non-tender Incision/Wound: clean and dry  Lab Results:   Recent Labs  06/25/14 0425 06/25/14 1600 06/26/14 0520  WBC 8.1  --  6.3  HGB 10.9* 9.6* 9.9*  HCT 34.1* 29.9* 30.6*  PLT 197  --  196   BMET  Recent Labs  06/24/14 1015  NA 138  K 3.8  CL 103  CO2 26  GLUCOSE 97  BUN 8  CREATININE 0.97  CALCIUM 8.9     Studies/Results: Dg Ugi W/water Sol Cm  06/25/2014   CLINICAL DATA:  Status post gastric sleeve surgery yesterday.  EXAM: WATER SOLUBLE UPPER GI SERIES  TECHNIQUE: Single-column upper GI series was performed using water soluble contrast.  CONTRAST:  50mL OMNIPAQUE IOHEXOL 300 MG/ML  SOLN orally  COMPARISON:  Preoperative examination of January 03, 2014.  FLUOROSCOPY TIME:  0 min, 34 seconds  FINDINGS: The patient ingested the oral contrast without difficulty. The contrast hung transiently at the GE junction then entered the diminutive gastric lumen. There was no extraluminal contrast. Contrast then passed into the duodenum. The patient experienced nausea at the conclusion of the study which she reported was due to taste of the contrast. She reported no discomfort otherwise.  IMPRESSION: There is no evidence of a leak or significant obstruction following the patient's  gastric sleeve procedure.   Electronically Signed   By: David  [place]   On: 06/25/2014 09:57    Anti-infectives: Anti-infectives   Start     Dose/Rate Route Frequency Ordered Stop   06/24/14 0952  cefOXitin (MEFOXIN) 2 g in dextrose 5 % 50 mL IVPB  Status:  Discontinued     2 g 100 mL/hr over 30 Minutes Intravenous On call to O.R. 06/24/14 4098 06/24/14 0954   06/24/14 0949  cefOXitin (MEFOXIN) 2 g in dextrose 5 % 50 mL IVPB     2 g 100 mL/hr over 30 Minutes Intravenous On call to O.R. 06/24/14 0949 06/24/14 1132      Assessment/Plan: s/p Procedure(s): LAPAROSCOPIC GASTRIC SLEEVE RESECTION Doing very well without apparent complication. Start protein shakes this morning and expect discharge later today.   LOS: 2 days    [name] T 06/26/2014

## 2014-06-26 NOTE — Progress Notes (Signed)
Patient alert and oriented, pain is controlled. Patient is tolerating fluids,  advanced to protein shake today, patient tolerating well.  Reviewed Gastric sleeve discharge instructions with patient and patient is able to articulate understanding.  Provided information on BELT program, Support Group and [facility] outpatient pharmacy. All questions answered, will continue to monitor.  

## 2014-07-01 ENCOUNTER — Other Ambulatory Visit (INDEPENDENT_AMBULATORY_CARE_PROVIDER_SITE_OTHER): Payer: Self-pay

## 2014-07-01 ENCOUNTER — Ambulatory Visit
Admission: RE | Admit: 2014-07-01 | Discharge: 2014-07-01 | Disposition: A | Discharge status: Home / Self Care | Payer: Managed Care, Other (non HMO) | Source: Ambulatory Visit | Attending: Surgery | Admitting: Surgery

## 2014-07-01 DIAGNOSIS — R1011 Right upper quadrant pain: Secondary | ICD-10-CM

## 2014-07-01 LAB — CBC WITH DIFFERENTIAL/PLATELET
BASOS ABS: 0 10*3/uL (ref 0.0–0.1)
BASOS PCT: 0 % (ref 0–1)
EOS PCT: 2 % (ref 0–5)
Eosinophils Absolute: 0.1 10*3/uL (ref 0.0–0.7)
HEMATOCRIT: 34.7 % — AB (ref 36.0–46.0)
Hemoglobin: 11.3 g/dL — ABNORMAL LOW (ref 12.0–15.0)
LYMPHS PCT: 30 % (ref 12–46)
Lymphs Abs: 1.9 10*3/uL (ref 0.7–4.0)
MCH: 26.8 pg (ref 26.0–34.0)
MCHC: 32.6 g/dL (ref 30.0–36.0)
MCV: 82.2 fL (ref 78.0–100.0)
MONO ABS: 0.6 10*3/uL (ref 0.1–1.0)
Monocytes Relative: 10 % (ref 3–12)
Neutro Abs: 3.6 10*3/uL (ref 1.7–7.7)
Neutrophils Relative %: 58 % (ref 43–77)
Platelets: 278 10*3/uL (ref 150–400)
RBC: 4.22 MIL/uL (ref 3.87–5.11)
RDW: 15.9 % — AB (ref 11.5–15.5)
WBC: 6.2 10*3/uL (ref 4.0–10.5)

## 2014-07-03 ENCOUNTER — Telehealth (HOSPITAL_COMMUNITY): Payer: Self-pay

## 2014-07-03 NOTE — Telephone Encounter (Signed)

## 2014-07-08 ENCOUNTER — Encounter: Payer: Managed Care, Other (non HMO) | Attending: General Surgery

## 2014-07-08 DIAGNOSIS — Z6833 Body mass index (BMI) 33.0-33.9, adult: Secondary | ICD-10-CM | POA: Diagnosis not present

## 2014-07-08 DIAGNOSIS — Z713 Dietary counseling and surveillance: Secondary | ICD-10-CM | POA: Insufficient documentation

## 2014-07-08 NOTE — Patient Instructions (Signed)
Patient to follow Phase 3A-Soft, High Protein Diet and follow-up at [facility] in 6 weeks for 2 months post-op nutrition visit for diet advancement. 

## 2014-07-08 NOTE — Progress Notes (Signed)
Bariatric Class:  Appt start time: 1530 end time:  1630.  2 Week Post-Operative Nutrition Class  Patient was seen on 07/08/14 for [facility]   Surgery date: 06/24/2014 Surgery type: Gastric sleeve [facility] weight at [facility]: 267 lbs on 03/20/2014 Weight today: 252.5 lbs Weight change: 22 lbs  TANITA  BODY COMP RESULTS  05/26/14 07/08/14   BMI (kg/m^2) 38.3 35.2   Fat Mass (lbs) 134 130.5   Fat Free Mass (lbs) 140.5 122.0   Total Body Water (lbs) 103 89.5    The following the learning objectives were met by the patient during this course:  Identifies Phase 3A (Soft, High Proteins) Dietary Goals and will begin from 2 weeks post-operatively to 2 months post-operatively  Identifies appropriate sources of fluids and proteins   States protein recommendations and appropriate sources post-operatively  Identifies the need for appropriate texture modifications, mastication, and bite sizes when consuming solids  Identifies appropriate multivitamin and calcium sources post-operatively  Describes the need for physical activity post-operatively and will follow MD recommendations  States when to call healthcare provider regarding medication questions or post-operative complications  Handouts given during class include:  Phase 3A: Soft, High Protein Diet Handout  Follow-Up Plan: Patient will follow-up at [facility] in 6 weeks for 2 month post-op nutrition visit for diet advancement per MD.

## 2014-08-21 ENCOUNTER — Encounter: Payer: Managed Care, Other (non HMO) | Attending: General Surgery | Admitting: Dietician

## 2014-08-21 DIAGNOSIS — Z6833 Body mass index (BMI) 33.0-33.9, adult: Secondary | ICD-10-CM | POA: Insufficient documentation

## 2014-08-21 DIAGNOSIS — Z713 Dietary counseling and surveillance: Secondary | ICD-10-CM | POA: Diagnosis not present

## 2014-08-21 NOTE — Patient Instructions (Addendum)
Goals:  Follow Phase 3B: High Protein + Non-Starchy Vegetables  Eat 3-6 small meals/snacks, every 3-5 hrs  Increase lean protein foods to meet 60g goal  Try chicken soup Unjury (can add more protein to it) aim to 1 per day  Increase fluid intake to 64oz +  Try to sip every few minutes.   Think about trying Isopure (clear flavored protein).  Try adding vanilla protein to coffee.  Avoid drinking 15 minutes before, during and 30 minutes after eating  Aim for >30 min of physical activity daily

## 2014-08-21 NOTE — Progress Notes (Addendum)
  Follow-up visit:  8 Weeks Post-Operative Sleeve Gastrectomy Surgery  Medical Nutrition Therapy:  Appt start time: 1610 end time:  1630.  Primary concerns today: Post-operative Bariatric Surgery Nutrition Management. No longer having pain when swallowing. Drinking more water now. Returns with a loss of 27 lbs of fat mass. Able to tolerate foods that she is eating.   Not getting in enough fluid - only about 22 oz per day.    Surgery date: 06/24/2014 Surgery type: Gastric sleeve [facility]: 267 lbs on 03/20/2014 Weight today: 239.5 lbs  Weight change: 13 lbs, 27 lbs fat mass lost  TANITA BODY COMP RESULTS  05/26/14 07/08/14 08/21/14  BMI (kg/m^2) 38.3 35.2 33.4  Fat Mass (lbs) 134 130.5 103.5  Fat Free Mass (lbs) 140.5 122.0 136.0  Total Body Water (lbs) 103 89.5 99.5    Preferred Learning Style:   No preference indicated   Learning Readiness:   Ready  24-hr recall: B (AM): yogurt (12 g) Snk (AM): cheese (6 g) L (PM): chicken (14 g) or soup Snk (PM): cheese stick (6 g) D (PM): 2 oz of protein (14 g) may skip  Snk (PM): none  Fluid intake: 16 oz water and 6 oz of coffee, 22 oz  Estimated total protein intake: 38 - 52 g   Medications: none Supplementation: taking  Using straws: No Drinking while eating: No Hair loss: No Carbonated beverages: No N/V/D/C: Constipation - taking stool softener once per week Dumping syndrome: No  Recent physical activity:  Walking with hand weights 2 x day (2 miles total 40 minutes total)  Progress Towards Goal(s):  In progress.  Handouts given during visit include:  Phase 3B High Protein + Non Starchy Vegetables  Samples given during visit include:  Unjury, lot # [id number], exp 12/2014  Nutritional Diagnosis:   [place]-3.3 Overweight/obesity related to past poor dietary habits and physical inactivity as evidenced by patient w/ recent sleeve gastrectomcy surgery following dietary guidelines for continued  weight loss.    Intervention:  Nutrition education/diet advancment. Goals:  Follow Phase 3B: High Protein + Non-Starchy Vegetables  Eat 3-6 small meals/snacks, every 3-5 hrs  Increase lean protein foods to meet 60g goal  Try chicken soup Unjury (can add more protein to it) aim to 1 per day  Increase fluid intake to 64oz +  Try to sip every few minutes.   Think about trying Isopure (clear flavored protein).  Try adding vanilla protein to coffee.  Avoid drinking 15 minutes before, during and 30 minutes after eating  Aim for >30 min of physical activity daily  Teaching Method Utilized:  Visual Auditory Hands on  Barriers to learning/adherence to lifestyle change: has trouble drinking, gets full very easily  Demonstrated degree of understanding via:  Teach Back   Monitoring/Evaluation:  Dietary intake, exercise, and body weight. Follow up in 1 months for 3 month post-op visit.

## 2014-10-02 ENCOUNTER — Ambulatory Visit: Payer: Managed Care, Other (non HMO) | Admitting: Dietician

## 2014-12-03 ENCOUNTER — Other Ambulatory Visit (HOSPITAL_COMMUNITY)
Admission: RE | Admit: 2014-12-03 | Discharge: 2014-12-03 | Disposition: A | Discharge status: Home / Self Care | Payer: BLUE CROSS/BLUE SHIELD | Source: Ambulatory Visit | Attending: Obstetrics and Gynecology | Admitting: Obstetrics and Gynecology

## 2014-12-03 ENCOUNTER — Other Ambulatory Visit: Payer: Self-pay | Admitting: Obstetrics and Gynecology

## 2014-12-03 DIAGNOSIS — Z1151 Encounter for screening for human papillomavirus (HPV): Secondary | ICD-10-CM | POA: Insufficient documentation

## 2014-12-03 DIAGNOSIS — Z01411 Encounter for gynecological examination (general) (routine) with abnormal findings: Secondary | ICD-10-CM | POA: Insufficient documentation

## 2014-12-04 LAB — CYTOLOGY - PAP

## 2014-12-10 ENCOUNTER — Other Ambulatory Visit: Payer: Self-pay | Admitting: Obstetrics and Gynecology

## 2014-12-10 ENCOUNTER — Encounter: Payer: BLUE CROSS/BLUE SHIELD | Attending: General Surgery | Admitting: Dietician

## 2014-12-10 DIAGNOSIS — Z6830 Body mass index (BMI) 30.0-30.9, adult: Secondary | ICD-10-CM | POA: Diagnosis not present

## 2014-12-10 DIAGNOSIS — Z713 Dietary counseling and surveillance: Secondary | ICD-10-CM | POA: Insufficient documentation

## 2014-12-10 NOTE — Progress Notes (Signed)
  Follow-up visit:  6 Months  Post-Operative Sleeve Gastrectomy Surgery  Medical Nutrition Therapy:  Appt start time: 1045 end time:  1110 .  Primary concerns today: Post-operative Bariatric Surgery Nutrition Management. Returns with a 20.5 lb weight loss. Weight Has been been the same for 2 months. Able to drink more fluid now. Cannot "stand" protein drinks.  Surgery date: 06/24/2014 Surgery type: Gastric sleeve [facility] lbs on 03/20/2014 Weight today: 219.0 lbs  Weight change: 20.5 lbs  Total weight loss: 48 lbs  TANITA BODY COMP RESULTS  05/26/14 07/08/14 08/21/14 12/10/14  BMI (kg/m^2) 38.3 35.2 33.4 30.5  Fat Mass (lbs) 134 130.5 103.5 92.5  Fat Free Mass (lbs) 140.5 122.0 136.0 126.5  Total Body Water (lbs) 103 89.5 99.5 92.5    Preferred Learning Style:   No preference indicated   Learning Readiness:   Ready  24-hr recall: B (AM): 2 eggs with cheese egg or oatmeal with fruit or bacon, egg, and cheese (2-20 g) Snk (AM): apple or [employer] Breakfast bar or baked lays or cheese (0-6 g) L (PM): salad with vegetables, fruit, craisins, 1 oz cheese and basalmic vinegraitte (7 g) Snk (PM): cheddar chex mix D (PM): 2 oz chicken (baked wings) or fish with vegetables with corn (14 g) Snk (PM): none  Fluid intake: 64 oz water and 6 oz of coffee with caramel syrup and creamer  Estimated total protein intake: 30-47 g   Medications: none Supplementation: taking  Using straws: No Drinking while eating: No Hair loss: No Carbonated beverages: sometimes - starback energy drink (with sugar) N/V/D/C: No Dumping syndrome: No  Recent physical activity:  Boot camp - M-W for 45-60 minutes, Walking with hand weights 4 x week  (2 miles total 40 minutes total)  Progress Towards Goal(s):  In progress.  Handouts given during visit include:  High Protein Snacks  Nutritional Diagnosis:   [place]-3.3 Overweight/obesity related to past poor dietary  habits and physical inactivity as evidenced by patient w/ recent sleeve gastrectomcy surgery following dietary guidelines for continued weight loss.    Intervention:  Nutrition education/diet reinforcement  Goals:  Follow Phase 3B: High Protein + Non-Starchy Vegetables  Eat 3-6 small meals/snacks, every 3-5 hrs  Increase lean protein foods to meet 60g goal  Aim to eat 2-3 oz of protein for each meal (eat protein first).  For snacks, have jerky, yogurt, cheese and meat, almonds, hard boiled egg, protein chips  Limit sugar, carbs, and dried fruit  Have some fruit if you are still hungry after protein and vegetables  Avoid drinking 15 minutes before, during and 30 minutes after eating  Aim for >30 min of physical activity daily  Try having coffee with stevia and measure out creamer (or look for sugar free flavoring)  Avoid juice and energy drinks  Teaching Method Utilized:  Visual Auditory Hands on  Barriers to learning/adherence to lifestyle change: none  Demonstrated degree of understanding via:  Teach Back   Monitoring/Evaluation:  Dietary intake, exercise, and body weight. Follow up in 6 weeks for 7.5 month post-op visit.

## 2014-12-10 NOTE — Patient Instructions (Addendum)
Goals:  Follow Phase 3B: High Protein + Non-Starchy Vegetables  Eat 3-6 small meals/snacks, every 3-5 hrs  Increase lean protein foods to meet 60g goal  Aim to eat 2-3 oz of protein for each meal (eat protein first).  For snacks, have jerky, yogurt, cheese and meat, almonds, hard boiled egg, protein chips  Limit sugar, carbs, and dried fruit  Have some fruit if you are still hungry after protein and vegetables  Avoid drinking 15 minutes before, during and 30 minutes after eating  Aim for >30 min of physical activity daily  Try having coffee with stevia and measure out creamer (or look for sugar free flavoring)  Avoid juice and energy drinks

## 2015-01-21 ENCOUNTER — Encounter: Payer: BLUE CROSS/BLUE SHIELD | Attending: General Surgery | Admitting: Dietician

## 2015-01-21 DIAGNOSIS — Z713 Dietary counseling and surveillance: Secondary | ICD-10-CM | POA: Diagnosis not present

## 2015-01-21 DIAGNOSIS — Z6830 Body mass index (BMI) 30.0-30.9, adult: Secondary | ICD-10-CM | POA: Diagnosis not present

## 2015-01-21 NOTE — Patient Instructions (Addendum)
Goals:  Follow Phase 3B: High Protein + Non-Starchy Vegetables  Eat 3-6 small meals/snacks, every 3-5 hrs  Increase lean protein foods to meet 60g goal  Aim to eat 2-3 oz of protein for each meal (eat protein first).  For snacks, have jerky, yogurt, cheese and meat, almonds, hard boiled egg  For now, only have fruit if you've had protein and vegetables first  Keep raw carrots and cucumbers around to add to snacks or meals to help fill you up  Avoid drinking 15 minutes before, during and 30 minutes after eating  Aim for >30 min of physical activity daily  Have unflavored protein in crystal light or other zero calorie drinks  Try increasing intensity of exercise and add more weights (use time differently)  Have lunch earlier (when you are hungry in late morning) and try having just one snack in afternoon (listening to your body's hunger cues)

## 2015-01-21 NOTE — Progress Notes (Signed)
  Follow-up visit:  7Months  Post-Operative Sleeve Gastrectomy Surgery  Medical Nutrition Therapy:  Appt start time: 1030 end time:  1100 .  Primary concerns today: Post-operative Bariatric Surgery Nutrition Management. Returns with no weight change. Feels like she has been exercising more and getting 10,000 steps per day. Started having Unjury Chicken Soup or unflavored sometimes. Feeling really hungry in the morning. Frustrated that she isn't losing weight.   Surgery date: 06/24/2014 Surgery type: Gastric sleeve [facility]: 267 lbs on 03/20/2014 Weight today: 219.0 lbs  Weight change: 0 lbs  Total weight loss: 48 lbs  TANITA BODY COMP RESULTS  05/26/14 07/08/14 08/21/14 12/10/14 01/21/15  BMI (kg/m^2) 38.3 35.2 33.4 30.5 30.5  Fat Mass (lbs) 134 130.5 103.5 92.5 94.5  Fat Free Mass (lbs) 140.5 122.0 136.0 126.5 124.5  Total Body Water (lbs) 103 89.5 99.5 92.5 91.0    Preferred Learning Style:   No preference indicated   Learning Readiness:   Ready  24-hr recall: Starts day with unjury chicken soup (21 g) B (AM): boiled egg white with string cheese (15 g)  Snk (AM): apple or or cheese (0-6 g) L (PM): salad with vegetables, 2 oz chicken, 1 oz cheese and basalmic vinegraitte (21 g) Snk (PM): mixed nuts (handful) (6 g) D (PM): 4 oz meat sometimes with meat (28 g) Snk (PM): none  Fluid intake: 64 oz water, chicken soup unjury each morning (21 g of protein)  Estimated total protein intake: ~77-85 g   Medications: started birth control patch Supplementation: taking  Using straws: No Drinking while eating: No Hair loss: No Carbonated beverages: No N/V/D/C: No Dumping syndrome: No  Recent physical activity:  Walking with hand weights or jog 4 x week  (80 minutes total)  Progress Towards Goal(s):  In progress.  Handouts given during visit include:  High Protein Snacks  Nutritional Diagnosis:   [place]-3.3 Overweight/obesity related to past poor  dietary habits and physical inactivity as evidenced by patient w/ recent sleeve gastrectomcy surgery following dietary guidelines for continued weight loss.    Intervention:  Nutrition education/diet reinforcement  Goals:  Follow Phase 3B: High Protein + Non-Starchy Vegetables  Eat 3-6 small meals/snacks, every 3-5 hrs  Increase lean protein foods to meet 60g goal  Aim to eat 2-3 oz of protein for each meal (eat protein first).  For snacks, have jerky, yogurt, cheese and meat, almonds, hard boiled egg, protein chips  Limit sugar, carbs, and dried fruit  Have some fruit if you are still hungry after protein and vegetables  Avoid drinking 15 minutes before, during and 30 minutes after eating  Aim for >30 min of physical activity daily  Try having coffee with stevia and measure out creamer (or look for sugar free flavoring)  Avoid juice and energy drinks  Teaching Method Utilized:  Visual Auditory Hands on  Barriers to learning/adherence to lifestyle change: none  Demonstrated degree of understanding via:  Teach Back   Monitoring/Evaluation:  Dietary intake, exercise, and body weight. Follow up in 6 weeks for 7.5 month post-op visit.

## 2015-03-23 ENCOUNTER — Encounter: Payer: Self-pay | Admitting: Dietician

## 2015-03-23 ENCOUNTER — Encounter: Payer: BLUE CROSS/BLUE SHIELD | Attending: General Surgery | Admitting: Dietician

## 2015-03-23 DIAGNOSIS — Z6830 Body mass index (BMI) 30.0-30.9, adult: Secondary | ICD-10-CM | POA: Diagnosis not present

## 2015-03-23 DIAGNOSIS — Z713 Dietary counseling and surveillance: Secondary | ICD-10-CM | POA: Insufficient documentation

## 2015-03-23 NOTE — Progress Notes (Signed)
  Follow-up visit: 9 Months  Post-Operative Sleeve Gastrectomy Surgery  Medical Nutrition Therapy:  Appt start time: 1040 end time:  1100.  Primary concerns today: Post-operative Bariatric Surgery Nutrition Management. Returns with an 8.5 lb weight loss. Always hungry in the morning, though started having lunch earlier instead of snacking.   Surgery date: 06/24/2014 Surgery type: Gastric sleeve Start weight at [facility]: 267 lbs on 03/20/2014 Weight today: 210.5 lbs  Weight change: 8.5 lbs  Total weight loss: 56.5 lbs Weight loss goal: under 200 lbs  TANITA BODY COMP RESULTS  05/26/14 07/08/14 08/21/14 12/10/14 01/21/15 03/23/15  BMI (kg/m^2) 38.3 35.2 33.4 30.5 30.5 29.4  Fat Mass (lbs) 134 130.5 103.5 92.5 94.5 86.5  Fat Free Mass (lbs) 140.5 122.0 136.0 126.5 124.5 124.0  Total Body Water (lbs) 103 89.5 99.5 92.5 91.0 91.0    Preferred Learning Style:   No preference indicated   Learning Readiness:   Ready  24-hr recall: B (AM): boiled egg white with string cheese or yogurt (12-15 g)  Snk (AM): none L (PM): salad with vegetables, 3-4 oz tuna or chicken, 1 oz cheese and basalmic vinegraitte (21-28 g) Snk (PM): none D (PM): 4 oz meat sometimes (28 g) Snk (PM): yogurt or 2% low fat milk  (8-12 g)  Fluid intake: 64+ oz water, milk (21 g of protein)  Estimated total protein intake: 69-83 g   Medications: started birth control patch Supplementation: taking  Using straws: No Drinking while eating: No Hair loss: Had some  Carbonated beverages: No N/V/D/C: No Dumping syndrome: No  Recent physical activity:  Walking with hand weights 4 x week  (30 minutes total)  Progress Towards Goal(s):  In progress.  Nutritional Diagnosis:   [place]-3.3 Overweight/obesity related to past poor dietary habits and physical inactivity as evidenced by patient w/ recent sleeve gastrectomcy surgery following dietary guidelines for continued weight loss.    Intervention:  Nutrition  education/diet reinforcement  Goals:  Follow Phase 3B: High Protein + Non-Starchy Vegetables  Eat 3-6 small meals/snacks, every 3-5 hrs  Increase lean protein foods to meet 60g goal  For snacks, have jerky, yogurt, cheese and meat, almonds, hard boiled egg  Try having having fruit with breakfast  Try having lower fat milk first thing in the morning  Avoid drinking 15 minutes before, during and 30 minutes after eating  Aim for >30 min of physical activity daily  Try increasing intensity of exercise and add more weights (use time differently)  Teaching Method Utilized:  Visual Auditory Hands on  Barriers to learning/adherence to lifestyle change: none  Demonstrated degree of understanding via:  Teach Back   Monitoring/Evaluation:  Dietary intake, exercise, and body weight. Follow up in 3 months for 12 month post-op visit.

## 2015-03-23 NOTE — Patient Instructions (Addendum)
Goals:  Follow Phase 3B: High Protein + Non-Starchy Vegetables  Eat 3-6 small meals/snacks, every 3-5 hrs  Increase lean protein foods to meet 60g goal  For snacks, have jerky, yogurt, cheese and meat, almonds, hard boiled egg  Try having having fruit with breakfast  Try having lower fat milk first thing in the morning  Avoid drinking 15 minutes before, during and 30 minutes after eating  Aim for >30 min of physical activity daily  Try increasing intensity of exercise and add more weights (use time differently)  Surgery date: 06/24/2014 Surgery type: Gastric sleeve Start weight at [facility]: 267 lbs on 03/20/2014 Weight today: 210.5 lbs  Weight change: 8.5 lbs  Total weight loss: 56.5 lbs  TANITA BODY COMP RESULTS  05/26/14 07/08/14 08/21/14 12/10/14 01/21/15 03/23/15  BMI (kg/m^2) 38.3 35.2 33.4 30.5 30.5 29.4  Fat Mass (lbs) 134 130.5 103.5 92.5 94.5 86.5  Fat Free Mass (lbs) 140.5 122.0 136.0 126.5 124.5 124.0  Total Body Water (lbs) 103 89.5 99.5 92.5 91.0 91.0

## 2015-06-22 ENCOUNTER — Ambulatory Visit: Payer: BLUE CROSS/BLUE SHIELD | Admitting: Dietician

## 2015-09-18 IMAGING — RF DG UGI W/ GASTROGRAFIN
9 series · 9 of 9 positions shown · IV contrast (omnipaque)
Comparison: Preoperative examination January 03, 2014.

FLUOROSCOPY TIME:  0 min, 34 seconds

CLINICAL DATA: Status post gastric sleeve surgery yesterday.

EXAM:
WATER SOLUBLE UPPER GI SERIES
TECHNIQUE: Single-column upper GI series was performed using water soluble
contrast.
CONTRAST:  50mL OMNIPAQUE IOHEXOL 300 MG/ML  SOLN orally

[Series 1: run · 1 of 1 slices shown (1 of 8)]
[im 1/1]
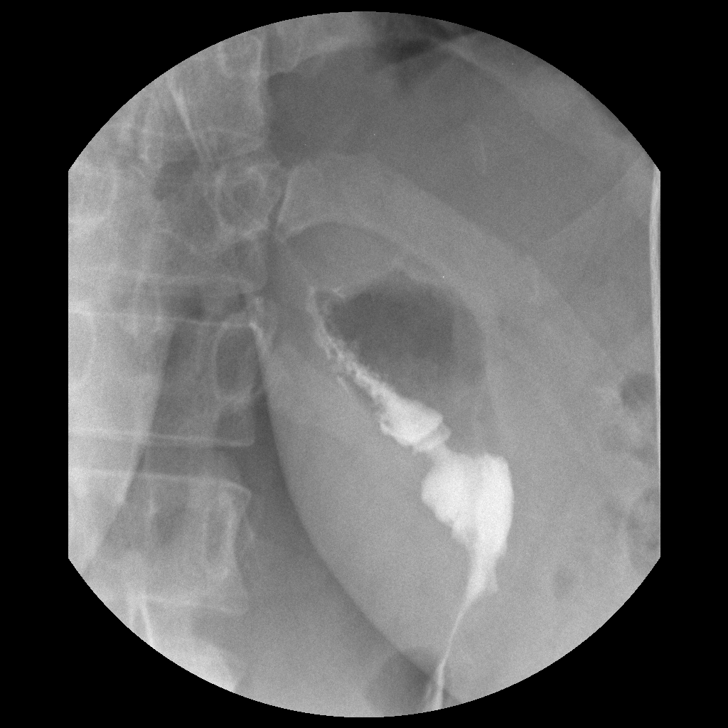

[Series 2: run · 1 of 1 slices shown (2 of 8)]
[im 1/1]
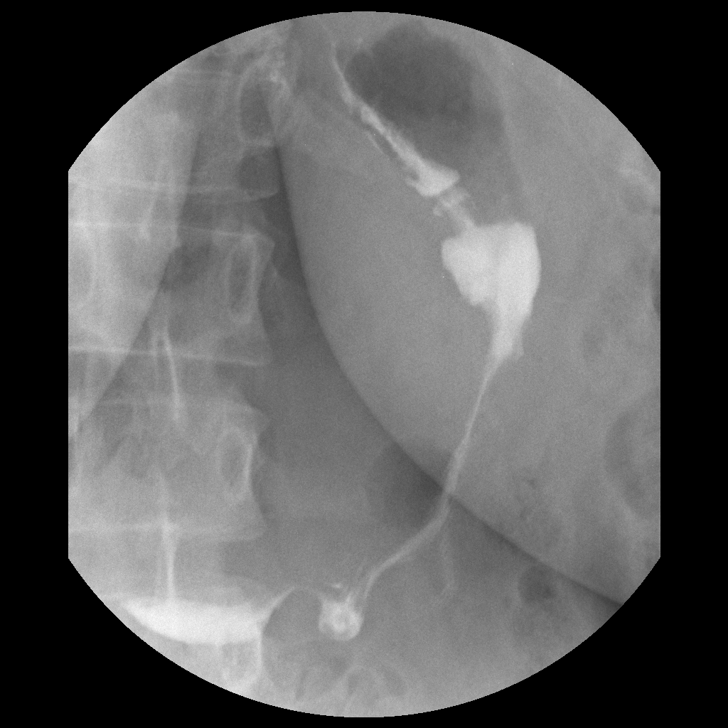

[Series 3: run · 1 of 1 slices shown (3 of 8)]
[im 1/1]
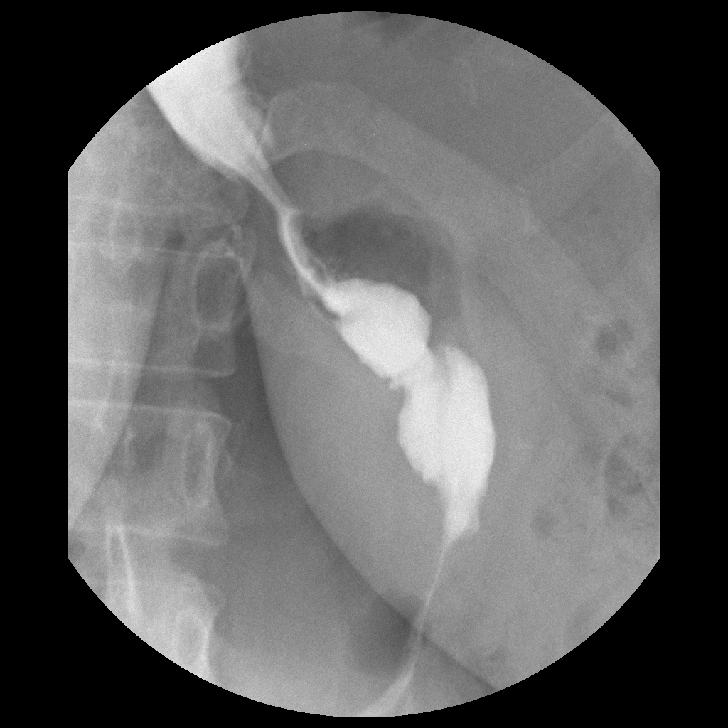

[Series 4: run · 1 of 1 slices shown (4 of 8)]
[im 1/1]
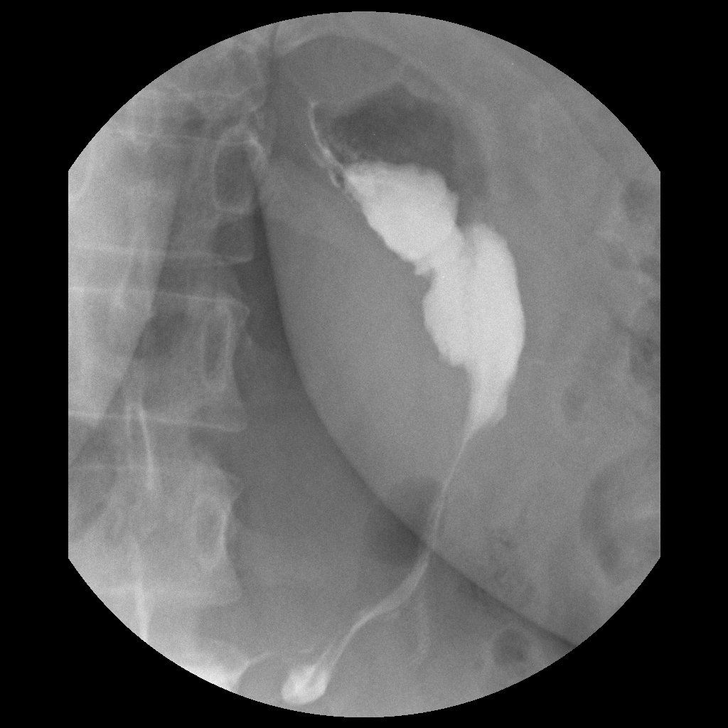

[Series 5: run · 1 of 1 slices shown (5 of 8)]
[im 1/1]
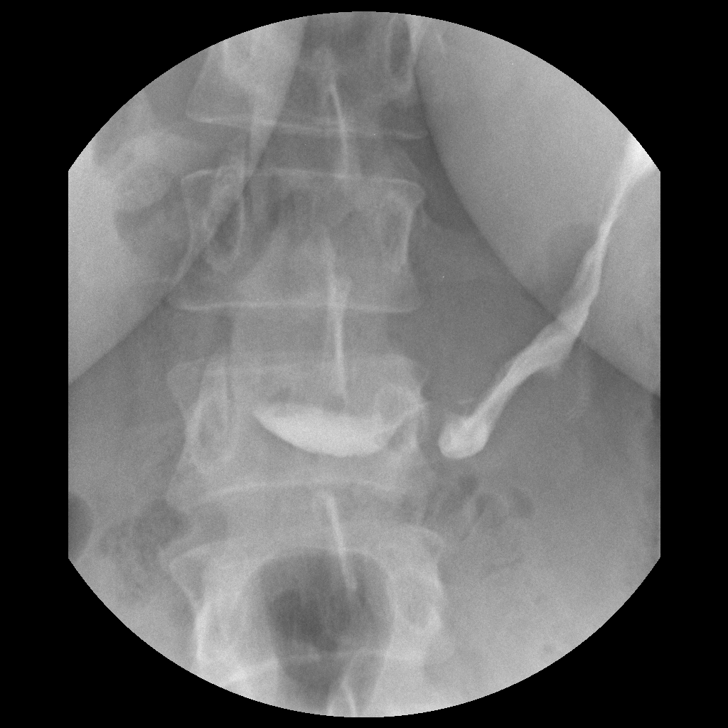

[Series 6: run · 1 of 1 slices shown (6 of 8)]
[im 1/1]
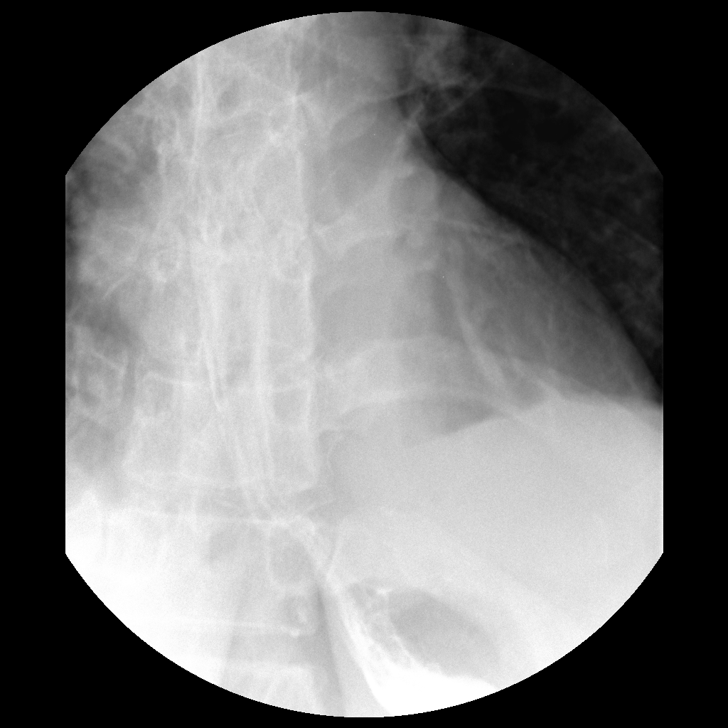

[Series 7: run · 1 of 1 slices shown (7 of 8)]
[im 1/1]
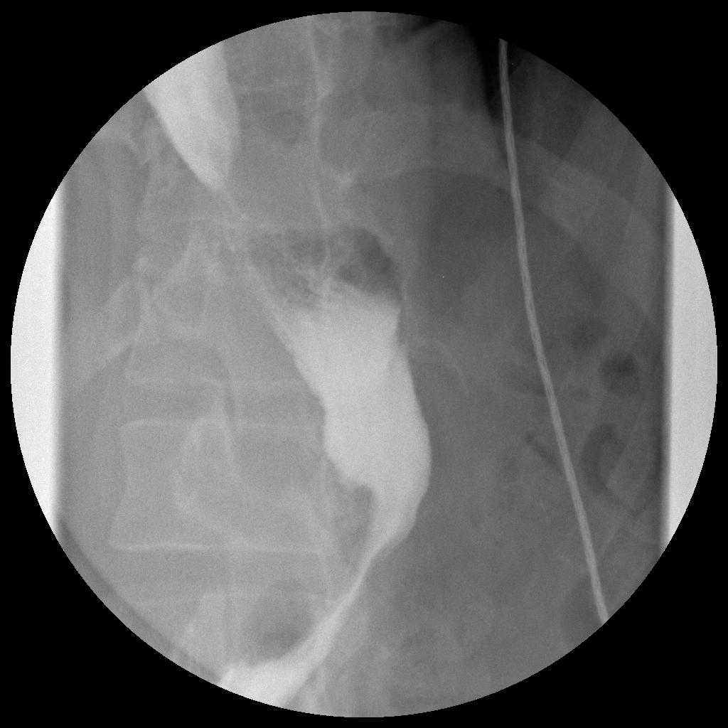

[Series 8: run · 1 of 1 slices shown (8 of 8)]
[im 1/1]
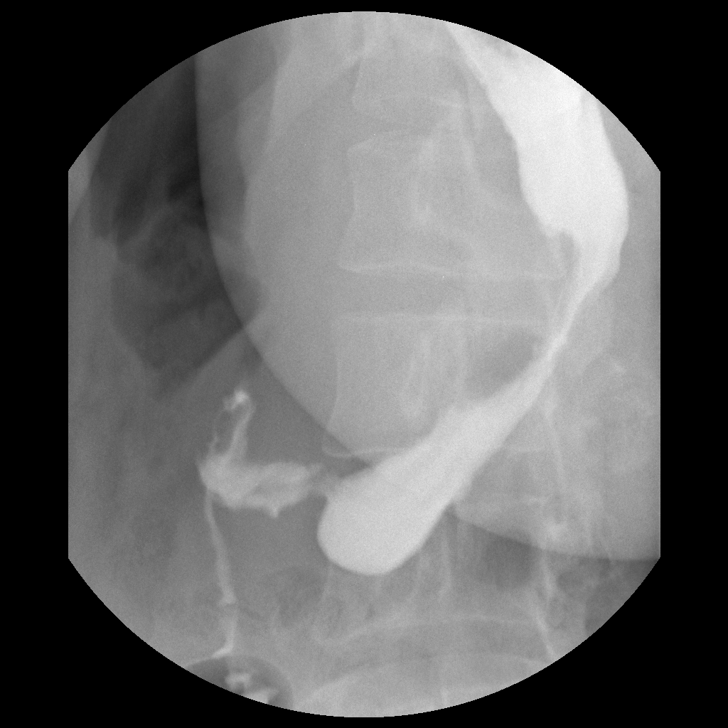

[Series 1001: view not recorded · 0.20mm/px · 1 of 1 slices shown]
[im 1/1]
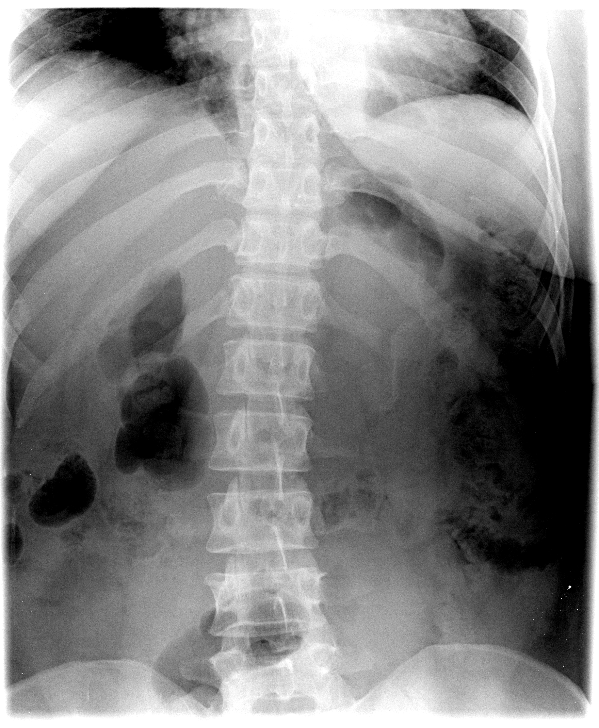

[9 of 9 positions shown; findings below may reference images not displayed]

FINDINGS: The patient ingested the oral contrast without difficulty. The
contrast hung transiently at the GE junction then entered the
diminutive gastric lumen. There was no extraluminal contrast.
Contrast then passed into the duodenum. The patient experienced
nausea at the conclusion of the study which she reported was due to
taste of the contrast. She reported no discomfort otherwise.
IMPRESSION: There is no evidence of a leak or significant obstruction following
the patient's gastric sleeve procedure.

## 2016-01-04 ENCOUNTER — Other Ambulatory Visit: Payer: Self-pay

## 2016-01-04 DIAGNOSIS — Z1231 Encounter for screening mammogram for malignant neoplasm of breast: Secondary | ICD-10-CM

## 2016-01-19 ENCOUNTER — Ambulatory Visit: Payer: BLUE CROSS/BLUE SHIELD

## 2016-01-20 ENCOUNTER — Other Ambulatory Visit: Payer: Self-pay

## 2016-01-20 DIAGNOSIS — Z1231 Encounter for screening mammogram for malignant neoplasm of breast: Secondary | ICD-10-CM

## 2016-01-27 ENCOUNTER — Ambulatory Visit
Admission: RE | Admit: 2016-01-27 | Discharge: 2016-01-27 | Disposition: A | Discharge status: Home / Self Care | Payer: BLUE CROSS/BLUE SHIELD | Source: Ambulatory Visit

## 2016-01-27 DIAGNOSIS — Z1231 Encounter for screening mammogram for malignant neoplasm of breast: Secondary | ICD-10-CM

## 2016-08-10 DIAGNOSIS — Z0000 Encounter for general adult medical examination without abnormal findings: Secondary | ICD-10-CM | POA: Diagnosis not present

## 2016-08-23 DIAGNOSIS — H16141 Punctate keratitis, right eye: Secondary | ICD-10-CM | POA: Diagnosis not present

## 2017-01-10 ENCOUNTER — Other Ambulatory Visit: Payer: Self-pay | Admitting: Obstetrics and Gynecology

## 2017-01-10 DIAGNOSIS — D500 Iron deficiency anemia secondary to blood loss (chronic): Secondary | ICD-10-CM | POA: Diagnosis not present

## 2017-01-10 DIAGNOSIS — Z01411 Encounter for gynecological examination (general) (routine) with abnormal findings: Secondary | ICD-10-CM | POA: Diagnosis not present

## 2017-01-10 DIAGNOSIS — Z1231 Encounter for screening mammogram for malignant neoplasm of breast: Secondary | ICD-10-CM

## 2017-01-24 ENCOUNTER — Encounter (HOSPITAL_COMMUNITY): Payer: Self-pay

## 2017-01-30 ENCOUNTER — Ambulatory Visit
Admission: RE | Admit: 2017-01-30 | Discharge: 2017-01-30 | Disposition: A | Discharge status: Home / Self Care | Payer: BLUE CROSS/BLUE SHIELD | Source: Ambulatory Visit | Attending: Obstetrics and Gynecology | Admitting: Obstetrics and Gynecology

## 2017-01-30 DIAGNOSIS — Z1231 Encounter for screening mammogram for malignant neoplasm of breast: Secondary | ICD-10-CM

## 2017-01-31 ENCOUNTER — Ambulatory Visit: Payer: BLUE CROSS/BLUE SHIELD

## 2017-01-31 ENCOUNTER — Other Ambulatory Visit (HOSPITAL_BASED_OUTPATIENT_CLINIC_OR_DEPARTMENT_OTHER): Payer: BLUE CROSS/BLUE SHIELD

## 2017-01-31 DIAGNOSIS — D500 Iron deficiency anemia secondary to blood loss (chronic): Secondary | ICD-10-CM

## 2017-01-31 LAB — CBC WITH DIFFERENTIAL (CANCER CENTER ONLY)
BASO#: 0 10*3/uL (ref 0.0–0.2)
BASO%: 0.8 % (ref 0.0–2.0)
EOS%: 2.1 % (ref 0.0–7.0)
Eosinophils Absolute: 0.1 10*3/uL (ref 0.0–0.5)
HCT: 31.1 % — ABNORMAL LOW (ref 34.8–46.6)
HEMOGLOBIN: 9.8 g/dL — AB (ref 11.6–15.9)
LYMPH#: 1.5 10*3/uL (ref 0.9–3.3)
LYMPH%: 37.8 % (ref 14.0–48.0)
MCH: 24.4 pg — ABNORMAL LOW (ref 26.0–34.0)
MCHC: 31.5 g/dL — AB (ref 32.0–36.0)
MCV: 78 fL — AB (ref 81–101)
MONO#: 0.4 10*3/uL (ref 0.1–0.9)
MONO%: 10.4 % (ref 0.0–13.0)
NEUT#: 1.9 10*3/uL (ref 1.5–6.5)
NEUT%: 48.9 % (ref 39.6–80.0)
Platelets: 184 10*3/uL (ref 145–400)
RBC: 4.01 10*6/uL (ref 3.70–5.32)
RDW: 19.1 % — ABNORMAL HIGH (ref 11.1–15.7)
WBC: 3.9 10*3/uL (ref 3.9–10.0)

## 2017-01-31 LAB — IRON AND TIBC
%SAT: 17 % — AB (ref 21–57)
Iron: 61 ug/dL (ref 41–142)
TIBC: 361 ug/dL (ref 236–444)
UIBC: 300 ug/dL (ref 120–384)

## 2017-01-31 LAB — FERRITIN: Ferritin: 6 ng/ml — ABNORMAL LOW (ref 9–269)

## 2017-02-01 ENCOUNTER — Ambulatory Visit (HOSPITAL_BASED_OUTPATIENT_CLINIC_OR_DEPARTMENT_OTHER): Payer: BLUE CROSS/BLUE SHIELD | Admitting: Family

## 2017-02-01 ENCOUNTER — Ambulatory Visit (HOSPITAL_BASED_OUTPATIENT_CLINIC_OR_DEPARTMENT_OTHER): Payer: BLUE CROSS/BLUE SHIELD

## 2017-02-01 VITALS — BP 124/67 | HR 71

## 2017-02-01 DIAGNOSIS — K909 Intestinal malabsorption, unspecified: Secondary | ICD-10-CM

## 2017-02-01 DIAGNOSIS — Z9884 Bariatric surgery status: Secondary | ICD-10-CM | POA: Diagnosis not present

## 2017-02-01 DIAGNOSIS — D508 Other iron deficiency anemias: Secondary | ICD-10-CM

## 2017-02-01 DIAGNOSIS — D509 Iron deficiency anemia, unspecified: Secondary | ICD-10-CM

## 2017-02-01 HISTORY — DX: Intestinal malabsorption, unspecified: K90.9

## 2017-02-01 LAB — HEMOGLOBINOPATHY EVALUATION
HGB C: 0 %
HGB S: 0 %
HGB VARIANT: 0 %
Hemoglobin A2 Quantitation: 2.1 % (ref 1.8–3.2)
Hemoglobin F Quantitation: 0 % (ref 0.0–2.0)
Hgb A: 97.9 % (ref 96.4–98.8)

## 2017-02-01 LAB — RETICULOCYTES: Reticulocyte Count: 0.7 % (ref 0.6–2.6)

## 2017-02-01 MED ORDER — SODIUM CHLORIDE 0.9 % IV SOLN
510.0000 mg | Freq: Once | INTRAVENOUS | Status: AC
  Administered 2017-02-01: 510 mg via INTRAVENOUS
  Filled 2017-02-01: qty 17

## 2017-02-01 MED ORDER — SODIUM CHLORIDE 0.9 % IV SOLN
Freq: Once | INTRAVENOUS | Status: AC
  Administered 2017-02-01: 11:00:00 via INTRAVENOUS

## 2017-02-01 NOTE — Progress Notes (Signed)
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 She has malabsorption secondary to having had the gastric sleeve procedure in September 2015.  Hgb is 9.8 with an iron saturation of 17% and ferritin of 6.  She thinks that her brother may also have anemia as well. No personal or familial history of cancer that she knows of.  She is a mother of 3 girls, one with history of cerebral palsy. She had her IUD removed recently and is not currently on birth control. She has regular cycles that have been light.  She denies fatigued. She is symptomatic with numbness and tingling in her fingertips and chewing lots of ice.  She has had no fever, chills, n/v, cough, rash, dizziness, SOB, chest pain, palpitations, abdominal pain or changes in bowel or bladder habits.  No swelling or tenderness in her extremities. No c/o pain at this time.  She has never been a smoker and does not drink alcoholic beverages.  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 She is also in school working on getting her bachelor's in Theatre manager. She will complete her degree in December 2019.    ROS: All other 10 point review of systems is negative.   PAST MEDICAL HISTORY:   Past Medical History:  Diagnosis Date  . Anemia    with pregnancy   . Hyperlipidemia   . Hypertension    was on htn medss- pt took herself off of - none since 01/2014.      ALLERGIES: No Known Allergies    MEDICATIONS:  Current Outpatient Prescriptions on File Prior to Visit  Medication Sig  Dispense Refill  . Multiple Vitamin (MULTIVITAMIN WITH MINERALS) TABS tablet Take 1 tablet by mouth every morning.     No current facility-administered medications on file prior to visit.      PAST SURGICAL HISTORY Past Surgical History:  Procedure Laterality Date  . LAPAROSCOPIC GASTRIC SLEEVE RESECTION N/A 06/24/2014   Procedure: LAPAROSCOPIC GASTRIC SLEEVE RESECTION;  Surgeon: Glenna Fellows, MD;  Location: [facility];  Service: General;  Laterality: N/A;  . NO PAST SURGERIES      FAMILY HISTORY: No family history on file.  SOCIAL HISTORY:  reports that she has never smoked. She has never used smokeless tobacco. She reports that she drinks alcohol. She reports that she does not use drugs.  PERFORMANCE STATUS: The patient's performance status is 1 - Symptomatic but completely ambulatory  PHYSICAL EXAM: Most Recent Vital Signs: Blood pressure 118/83, pulse 77, temperature 98.9 F (37.2 C), temperature source Oral, resp. rate 18, weight 213 lb (96.6 kg), last menstrual period 01/14/2017, SpO2 100 %. BP 118/83 (BP Location: Left Arm, Patient Position: Sitting)   Pulse 77   Temp 98.9 F (37.2 C) (Oral)   Resp 18   Wt 213 lb (96.6 kg)   LMP 01/14/2017 (Exact Date)   SpO2 100%   BMI 29.71 kg/m   General Appearance:    Alert, cooperative, no distress, appears stated age  Head:    Normocephalic, without  obvious abnormality, atraumatic  Eyes:    PERRL, conjunctiva/corneas clear, EOM's intact, fundi    benign, both eyes        Throat:   Lips, mucosa, and tongue normal; teeth and gums normal  Neck:   Supple, symmetrical, trachea midline, no adenopathy;    thyroid:  no enlargement/tenderness/nodules; no carotid   bruit or JVD  Back:     Symmetric, no curvature, ROM normal, no CVA tenderness  Lungs:     Clear to auscultation bilaterally, respirations unlabored  Chest Wall:    No tenderness or deformity   Heart:    Regular rate and rhythm, S1 and S2 normal, no murmur, rub   or  gallop     Abdomen:     Soft, non-tender, bowel sounds active all four quadrants,    no masses, no organomegaly        Extremities:   Extremities normal, atraumatic, no cyanosis or edema  Pulses:   2+ and symmetric all extremities  Skin:   Skin color, texture, turgor normal, no rashes or lesions  Lymph nodes:   Cervical, supraclavicular, and axillary nodes normal  Neurologic:   CNII-XII intact, normal strength, sensation and reflexes    throughout    LABORATORY DATA:  Results for orders placed or performed in visit on 01/31/17 (from the past 48 hour(s))  CBC with Differential [facility])     Status: Abnormal   Collection Time: 01/31/17 11:58 AM  Result Value Ref Range   WBC 3.9 3.9 - 10.0 10e3/uL   RBC 4.01 3.70 - 5.32 10e6/uL   HGB 9.8 (L) 11.6 - 15.9 g/dL   HCT 16.1 (L) 09.6 - 04.5 %   MCV 78 (L) 81 - 101 fL   MCH 24.4 (L) 26.0 - 34.0 pg   MCHC 31.5 (L) 32.0 - 36.0 g/dL   RDW 40.9 (H) 81.1 - 91.4 %   Platelets 184 145 - 400 10e3/uL   NEUT# 1.9 1.5 - 6.5 10e3/uL   LYMPH# 1.5 0.9 - 3.3 10e3/uL   MONO# 0.4 0.1 - 0.9 10e3/uL   Eosinophils Absolute 0.1 0.0 - 0.5 10e3/uL   BASO# 0.0 0.0 - 0.2 10e3/uL   NEUT% 48.9 39.6 - 80.0 %   LYMPH% 37.8 14.0 - 48.0 %   MONO% 10.4 0.0 - 13.0 %   EOS% 2.1 0.0 - 7.0 %   BASO% 0.8 0.0 - 2.0 %  Reticulocytes     Status: None   Collection Time: 01/31/17 11:58 AM  Result Value Ref Range   Reticulocyte Count 0.7 0.6 - 2.6 %  Ferritin     Status: Abnormal   Collection Time: 01/31/17 11:58 AM  Result Value Ref Range   Ferritin 6 (L) 9 - 269 ng/ml  Iron and TIBC     Status: Abnormal   Collection Time: 01/31/17 11:58 AM  Result Value Ref Range   Iron 61 41 - 142 ug/dL   TIBC 782 956 - 213 ug/dL   UIBC 086 578 - 469 ug/dL   %SAT 17 (L) 21 - 57 %      RADIOGRAPHY: Mm Screening Breast Tomo Bilateral  Result Date: 01/30/2017 CLINICAL DATA:  Screening. EXAM: 2D DIGITAL SCREENING BILATERAL MAMMOGRAM WITH CAD AND ADJUNCT TOMO COMPARISON:   Previous exam(s). ACR Breast Density Category b: There are scattered areas of fibroglandular density. FINDINGS: There are no findings suspicious for malignancy. Images were processed with CAD. IMPRESSION: No mammographic evidence of malignancy. A result letter of this screening mammogram  will be mailed directly to the patient. RECOMMENDATION: Screening mammogram in one year. (Code:SM-B-01Y) BI-RADS CATEGORY  1: Negative. Electronically Signed   By: Baird Lyons M.D.   On: 01/30/2017 15:51       PATHOLOGY: None   ASSESSMENT/PLAN: Rose Gonzalez is a very pleasant 41 yo African American female with iron deficiency anemia secondary to malabsorption due to having had the gastric sleeve procedure in September 2015. She is symptomatic at this time with numbness and tingling in her fingertips and chewing ice.  We will giver her iron today as well as a second dose in 8 days.  We will plan to see her back in 6 weeks for repeat lab work and follow-up.  Hemoglobinopathy eval result is pending.   All questions were answered. She will contact our office with any questions or concerns. We can certainly see her much sooner if needed.  She was discussed with and also seen by Dr. Myna Hidalgo and he is in agreement with the aforementioned.   [facility] M   Addendum:   I saw and examined the patient with Caiden Monsivais. I agree with the above assessment. She clearly has iron deficiency anemia secondary to her gastric sleeve. She just is not absorbing iron.  Her iron studies clear show significant iron deficiency. Her ferritin is only 6 with iron saturation of 17%.  There is no history of sickle cell in the family.  I did look at her blood smear. This showed hypochromic and microcytic red blood cells. There was moderate anisocytosis and poikilocytosis. I did not see any nucleator red blood cells. I do not see any hypersegmented polys or any immature myeloid or lymphoid forms. There were no platelets at licked at  normal.  We will go ahead and give her IV iron. I think this will clearly make her feel better. I suspect she will need 2 doses.  We spent about 40 minutes with her. We answered all of her questions. She is very nice. I know that we will be able to get her blood counts better.  Christin Bach, MD

## 2017-02-09 ENCOUNTER — Ambulatory Visit (HOSPITAL_BASED_OUTPATIENT_CLINIC_OR_DEPARTMENT_OTHER): Payer: BLUE CROSS/BLUE SHIELD

## 2017-02-09 VITALS — BP 117/79 | HR 74

## 2017-02-09 DIAGNOSIS — K909 Intestinal malabsorption, unspecified: Secondary | ICD-10-CM

## 2017-02-09 DIAGNOSIS — D508 Other iron deficiency anemias: Secondary | ICD-10-CM

## 2017-02-09 DIAGNOSIS — D509 Iron deficiency anemia, unspecified: Secondary | ICD-10-CM | POA: Diagnosis not present

## 2017-02-09 MED ORDER — HEPARIN SOD (PORK) LOCK FLUSH 100 UNIT/ML IV SOLN
250.0000 [IU] | Freq: Once | INTRAVENOUS | Status: DC | PRN
  Filled 2017-02-09: qty 5

## 2017-02-09 MED ORDER — SODIUM CHLORIDE 0.9% FLUSH
10.0000 mL | INTRAVENOUS | Status: DC | PRN
  Filled 2017-02-09: qty 10

## 2017-02-09 MED ORDER — HEPARIN SOD (PORK) LOCK FLUSH 100 UNIT/ML IV SOLN
500.0000 [IU] | Freq: Once | INTRAVENOUS | Status: DC | PRN
  Filled 2017-02-09: qty 5

## 2017-02-09 MED ORDER — SODIUM CHLORIDE 0.9% FLUSH
3.0000 mL | Freq: Once | INTRAVENOUS | Status: DC | PRN
  Filled 2017-02-09: qty 10

## 2017-02-09 MED ORDER — FERUMOXYTOL INJECTION 510 MG/17 ML
510.0000 mg | Freq: Once | INTRAVENOUS | Status: AC
  Administered 2017-02-09: 510 mg via INTRAVENOUS
  Filled 2017-02-09: qty 17

## 2017-02-09 MED ORDER — SODIUM CHLORIDE 0.9 % IV SOLN
Freq: Once | INTRAVENOUS | Status: AC
  Administered 2017-02-09: 10:00:00 via INTRAVENOUS

## 2017-02-09 NOTE — Patient Instructions (Signed)

## 2017-03-13 ENCOUNTER — Other Ambulatory Visit: Payer: Self-pay | Admitting: *Deleted

## 2017-03-13 DIAGNOSIS — D508 Other iron deficiency anemias: Secondary | ICD-10-CM

## 2017-03-14 ENCOUNTER — Other Ambulatory Visit (HOSPITAL_BASED_OUTPATIENT_CLINIC_OR_DEPARTMENT_OTHER): Payer: BLUE CROSS/BLUE SHIELD

## 2017-03-14 DIAGNOSIS — D508 Other iron deficiency anemias: Secondary | ICD-10-CM

## 2017-03-14 DIAGNOSIS — K909 Intestinal malabsorption, unspecified: Secondary | ICD-10-CM

## 2017-03-14 DIAGNOSIS — D509 Iron deficiency anemia, unspecified: Secondary | ICD-10-CM | POA: Diagnosis not present

## 2017-03-14 LAB — CBC WITH DIFFERENTIAL (CANCER CENTER ONLY)
BASO#: 0 10*3/uL (ref 0.0–0.2)
BASO%: 0.5 % (ref 0.0–2.0)
EOS ABS: 0.1 10*3/uL (ref 0.0–0.5)
EOS%: 1.3 % (ref 0.0–7.0)
HCT: 36.6 % (ref 34.8–46.6)
HEMOGLOBIN: 12.2 g/dL (ref 11.6–15.9)
LYMPH#: 1.3 10*3/uL (ref 0.9–3.3)
LYMPH%: 32.3 % (ref 14.0–48.0)
MCH: 28.4 pg (ref 26.0–34.0)
MCHC: 33.3 g/dL (ref 32.0–36.0)
MCV: 85 fL (ref 81–101)
MONO#: 0.4 10*3/uL (ref 0.1–0.9)
MONO%: 8.8 % (ref 0.0–13.0)
NEUT#: 2.3 10*3/uL (ref 1.5–6.5)
NEUT%: 57.1 % (ref 39.6–80.0)
PLATELETS: 199 10*3/uL (ref 145–400)
RBC: 4.3 10*6/uL (ref 3.70–5.32)
RDW: 19.9 % — ABNORMAL HIGH (ref 11.1–15.7)
WBC: 4 10*3/uL (ref 3.9–10.0)

## 2017-03-14 LAB — IRON AND TIBC
%SAT: 39 % (ref 21–57)
Iron: 89 ug/dL (ref 41–142)
TIBC: 229 ug/dL — ABNORMAL LOW (ref 236–444)
UIBC: 140 ug/dL (ref 120–384)

## 2017-03-14 LAB — FERRITIN: Ferritin: 55 ng/ml (ref 9–269)

## 2017-03-15 ENCOUNTER — Ambulatory Visit (HOSPITAL_BASED_OUTPATIENT_CLINIC_OR_DEPARTMENT_OTHER): Payer: BLUE CROSS/BLUE SHIELD | Admitting: Family

## 2017-03-15 ENCOUNTER — Other Ambulatory Visit: Payer: BLUE CROSS/BLUE SHIELD

## 2017-03-15 ENCOUNTER — Ambulatory Visit: Payer: BLUE CROSS/BLUE SHIELD

## 2017-03-15 VITALS — BP 122/84 | HR 66 | Temp 98.6°F | Resp 16 | Wt 214.0 lb

## 2017-03-15 DIAGNOSIS — K909 Intestinal malabsorption, unspecified: Secondary | ICD-10-CM | POA: Diagnosis not present

## 2017-03-15 DIAGNOSIS — D508 Other iron deficiency anemias: Secondary | ICD-10-CM | POA: Diagnosis not present

## 2017-03-15 LAB — RETICULOCYTES: RETICULOCYTE COUNT: 0.5 % — AB (ref 0.6–2.6)

## 2017-03-15 NOTE — Progress Notes (Signed)
Hematology and Oncology Follow Up Visit  Rose Gonzalez [id number] August 10, 1976 41 y.o. 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 She is feeling a little better but still having fatigued. She is still working 2 jobs and has stopped drinking caffeine.  She is no longer chewing ice and states that the numbness and tingling in her fingers is improved.   No fever, chills, n/v, cough, rash, dizziness, SOB, chest pain, palpitations, abdominal pain or changes in bowel or bladder habits.  No swelling and tingling in her extremities. No c/o pain at this time.  She has maintained a good appetite and is staying well hydrated. Her weight is stable.   ECOG Performance Status: 1 - Symptomatic but completely ambulatory  Medications:  Allergies as of 03/15/2017   No Known Allergies     Medication List       Accurate as of 03/15/17  9:25 AM. Always use your most recent med list.          multivitamin with minerals Tabs tablet Take 1 tablet by mouth every morning.       Allergies: No Known Allergies  Past Medical History, Surgical history, Social history, and Family History were reviewed and updated.  Review of Systems: All other 10 point review of systems is negative.   Physical Exam:  vitals were not taken for this visit.  Wt Readings from Last 3 Encounters:  02/01/17 213 lb (96.6 kg)  03/23/15 210 lb 8 oz (95.5 kg)  01/21/15 219 lb (99.3 kg)    Ocular: Sclerae unicteric, pupils equal, round and reactive to light Ear-nose-throat: Oropharynx clear, dentition fair Lymphatic: No cervical, supraclavicular or axillary adenopathy Lungs no rales or rhonchi, good excursion bilaterally Heart regular rate and rhythm, no murmur appreciated Abd soft, nontender, positive bowel sounds, no  liver or spleen tip palpated on exam, no fluid wave MSK no focal spinal tenderness, no joint edema Neuro: non-focal, well-oriented, appropriate affect Breasts: Deferred   Lab Results  Component Value Date   WBC 4.0 03/14/2017   HGB 12.2 03/14/2017   HCT 36.6 03/14/2017   MCV 85 03/14/2017   PLT 199 03/14/2017   Lab Results  Component Value Date   FERRITIN 55 03/14/2017   IRON 89 03/14/2017   TIBC 229 (L) 03/14/2017   UIBC 140 03/14/2017   IRONPCTSAT 39 03/14/2017   Lab Results  Component Value Date   RBC 4.30 03/14/2017   No results found for: KPAFRELGTCHN, LAMBDASER, KAPLAMBRATIO No results found for: IGGSERUM, IGA, IGMSERUM No results found for: Dorene Ar, A1GS, A2GS, Karn Pickler, SPEI   Chemistry      Component Value Date/Time   NA 138 06/24/2014 1015   K 3.8 06/24/2014 1015   CL 103 06/24/2014 1015   CO2 26 06/24/2014 1015   BUN 8 06/24/2014 1015   CREATININE 0.97 06/24/2014 1015   CREATININE 0.86 12/12/2013 1654      Component Value Date/Time   CALCIUM 8.9 06/24/2014 1015   ALKPHOS 40 06/24/2014 1015   AST 19 06/24/2014 1015   ALT 14 06/24/2014 1015   BILITOT 0.4 06/24/2014 1015      Impression and Plan: Rose Gonzalez is a very pleasant 41 yo African American female with iron deficiency anemia. She has responded nicely  to the 2 doses of IV iron she received in May. Her iron saturation is now 39% and ferritin 55. Hgb is improved to 12.2. Her symptoms continue to resolve.  We will plan to see her back again in 3 months for repeat lab work and follow-up.  She will contact our office with any questions or concerns. We can certainly see her sooner if need be.   Verdie MosherINCINNATI,Glen Kesinger M, NP 6/13/20189:25 AM

## 2017-04-27 DIAGNOSIS — Z903 Acquired absence of stomach [part of]: Secondary | ICD-10-CM | POA: Diagnosis not present

## 2017-05-29 ENCOUNTER — Encounter: Payer: BLUE CROSS/BLUE SHIELD | Attending: General Surgery | Admitting: Skilled Nursing Facility1

## 2017-05-29 ENCOUNTER — Encounter: Payer: Self-pay | Admitting: Skilled Nursing Facility1

## 2017-05-29 DIAGNOSIS — Z713 Dietary counseling and surveillance: Secondary | ICD-10-CM | POA: Insufficient documentation

## 2017-05-29 DIAGNOSIS — Z903 Acquired absence of stomach [part of]: Secondary | ICD-10-CM | POA: Insufficient documentation

## 2017-05-29 NOTE — Progress Notes (Signed)
  Primary concerns today: Post-operative Bariatric Surgery Nutrition Management.  Pt states she has gained some weight: 12 pounds from lowest weight so from 2016-2018 gaining 12 pounds. Pt had the sleeve gastrectomy in 2015. Pt states she has been needing blood infusions. Pt states her teeth have always been healthy until after the surgery. Pt states she has a bowel movement every other day and using miralax every day. Pt states she does not like to take pills and will not take multivitamin capsules.   TANITA  BODY COMP RESULTS  05/29/2017   BMI (kg/m^2) 29.37   Fat Mass (lbs) 68.6   Fat Free Mass (lbs) 142   Total Body Water (lbs) 102.2   24-hr recall: Lightest meal at dinner  B (AM): zeal with 2% fairlife milk and unjury protein powder and greek yogurt with fruit and granola Snk (AM):  fruit L (PM): lunch meat sandwich with [place] and cheese OR leftovers  Snk (PM):  fruit D (PM):  [place] meatloaf mashed potatoes and green beans OR pizza (every Friday) OR chicken and cabbage  Snk (PM): fruit  Fluid intake: coffee with sugary creamer, water 32 ounces of water Estimated total protein intake: 80+  Medications: see list  Supplementation: powdered multivitamin   Using straws: no Drinking while eating: no Having you been chewing well:no Chewing/swallowing difficulties: no Changes in vision: no Changes to mood/headaches: no Hair loss/Cahnges to skin/Changes to nails: no Any difficulty focusing or concentrating: no Sweating: no Dizziness/Lightheaded: no Palpitations: no  Carbonated beverages: no N/V/D/C/GAS: no Abdominal Pain: no  Dumping syndrome: no  Recent physical activity:  10000 steps a day  Progress Towards Goal(s):  In progress.  Intervention:  Nutrition counseling. Dietitian educated the pt on the need for the appropriate supplements as well as enough water. Goals: -Start taking 3 calcium's a day 2 hours a part -Start taking a new multivitamin -Try your weights at  your desk  - Try the kefir -Try sipping on your water throughout the day: aim for 2 bottles of water a day  Teaching Method Utilized:  Visual Auditory Hands on  Barriers to learning/adherence to lifestyle change: unsure  Demonstrated degree of understanding via:  Teach Back   Monitoring/Evaluation:  Dietary intake, exercise, and body weight.

## 2017-05-29 NOTE — Patient Instructions (Addendum)
-  Start taking 3 calcium's a day 2 hours a part  -Start taking a new multivitamin  -Try your weights at your desk   - Try the kefir  -Try sipping on your water throughout the day: aim for 2 bottles of water a day

## 2017-06-13 ENCOUNTER — Other Ambulatory Visit (HOSPITAL_BASED_OUTPATIENT_CLINIC_OR_DEPARTMENT_OTHER): Payer: BLUE CROSS/BLUE SHIELD

## 2017-06-13 DIAGNOSIS — D509 Iron deficiency anemia, unspecified: Secondary | ICD-10-CM | POA: Diagnosis not present

## 2017-06-13 DIAGNOSIS — K909 Intestinal malabsorption, unspecified: Secondary | ICD-10-CM

## 2017-06-13 DIAGNOSIS — D508 Other iron deficiency anemias: Secondary | ICD-10-CM

## 2017-06-13 LAB — CBC WITH DIFFERENTIAL (CANCER CENTER ONLY)
BASO#: 0 10*3/uL (ref 0.0–0.2)
BASO%: 0.5 % (ref 0.0–2.0)
EOS ABS: 0.1 10*3/uL (ref 0.0–0.5)
EOS%: 1.2 % (ref 0.0–7.0)
HEMATOCRIT: 37.9 % (ref 34.8–46.6)
HGB: 13.1 g/dL (ref 11.6–15.9)
LYMPH#: 1.2 10*3/uL (ref 0.9–3.3)
LYMPH%: 28.4 % (ref 14.0–48.0)
MCH: 31.3 pg (ref 26.0–34.0)
MCHC: 34.6 g/dL (ref 32.0–36.0)
MCV: 91 fL (ref 81–101)
MONO#: 0.3 10*3/uL (ref 0.1–0.9)
MONO%: 6.6 % (ref 0.0–13.0)
NEUT%: 63.3 % (ref 39.6–80.0)
NEUTROS ABS: 2.6 10*3/uL (ref 1.5–6.5)
Platelets: 187 10*3/uL (ref 145–400)
RBC: 4.18 10*6/uL (ref 3.70–5.32)
RDW: 11.7 % (ref 11.1–15.7)
WBC: 4.1 10*3/uL (ref 3.9–10.0)

## 2017-06-13 LAB — IRON AND TIBC
%SAT: 33 % (ref 21–57)
IRON: 104 ug/dL (ref 41–142)
TIBC: 313 ug/dL (ref 236–444)
UIBC: 208 ug/dL (ref 120–384)

## 2017-06-13 LAB — FERRITIN: Ferritin: 11 ng/ml (ref 9–269)

## 2017-06-14 ENCOUNTER — Ambulatory Visit (HOSPITAL_BASED_OUTPATIENT_CLINIC_OR_DEPARTMENT_OTHER): Payer: BLUE CROSS/BLUE SHIELD | Admitting: Family

## 2017-06-14 ENCOUNTER — Other Ambulatory Visit: Payer: BLUE CROSS/BLUE SHIELD

## 2017-06-14 VITALS — BP 121/73 | HR 63 | Temp 98.0°F | Resp 17 | Wt 212.0 lb

## 2017-06-14 DIAGNOSIS — D508 Other iron deficiency anemias: Secondary | ICD-10-CM

## 2017-06-14 DIAGNOSIS — Z9884 Bariatric surgery status: Secondary | ICD-10-CM | POA: Diagnosis not present

## 2017-06-14 DIAGNOSIS — D509 Iron deficiency anemia, unspecified: Secondary | ICD-10-CM

## 2017-06-14 DIAGNOSIS — K909 Intestinal malabsorption, unspecified: Secondary | ICD-10-CM | POA: Diagnosis not present

## 2017-06-14 DIAGNOSIS — D510 Vitamin B12 deficiency anemia due to intrinsic factor deficiency: Secondary | ICD-10-CM

## 2017-06-14 LAB — RETICULOCYTES: RETICULOCYTE COUNT: 1.1 % (ref 0.6–2.6)

## 2017-06-14 NOTE — Progress Notes (Signed)
Hematology and Oncology Follow Up Visit  Rose Gonzalez [id number] 01/13/1976 41 y.o. 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 Her iron studies are stable so she will not need an infusion at this time.  She started having heavy cycles and restarted her birth control patch 2 months ago. This has helped regulate and lessen her cycle quite a bit.  She has occasional fatigue. No other complaints at this time.  No fever, chills, n/v, cough, rash, dizziness, SOB, chest pain, palpitations, abdominal pain or changes in bowel or bladder habits.  No swelling, tenderness, numbness or tingling in her extremities. No c/o pain.  She has a good appetite and is staying well hydrated. Her weight is stable.   ECOG Performance Status: 0 - Asymptomatic  Medications:  Allergies as of 06/14/2017   No Known Allergies     Medication List       Accurate as of 06/14/17  9:05 AM. Always use your most recent med list.          multivitamin with minerals Tabs tablet Take 1 tablet by mouth every morning.       Allergies: No Known Allergies  Past Medical History, Surgical history, Social history, and Family History were reviewed and updated.  Review of Systems: All other 10 point review of systems is negative.   Physical Exam:  weight is 212 lb (96.2 kg). Her oral temperature is 98 F (36.7 C). Her blood pressure is 121/73 and her pulse is 63. Her respiration is 17 and oxygen saturation is 100%.   Wt Readings from Last 3 Encounters:  06/14/17 212 lb (96.2 kg)  05/29/17 210 lb 9.6 oz (95.5 kg)  03/15/17 214 lb (97.1 kg)    Ocular: Sclerae unicteric, pupils equal, round and reactive to light Ear-nose-throat: Oropharynx clear, dentition fair Lymphatic: No cervical, supraclavicular or axillary  adenopathy Lungs no rales or rhonchi, good excursion bilaterally Heart regular rate and rhythm, no murmur appreciated Abd soft, nontender, positive bowel sounds, no liver or spleen tip palpated on exam, no fluid wave  MSK no focal spinal tenderness, no joint edema Neuro: non-focal, well-oriented, appropriate affect Breasts: Deferred   Lab Results  Component Value Date   WBC 4.1 06/13/2017   HGB 13.1 06/13/2017   HCT 37.9 06/13/2017   MCV 91 06/13/2017   PLT 187 06/13/2017   Lab Results  Component Value Date   FERRITIN 11 06/13/2017   IRON 104 06/13/2017   TIBC 313 06/13/2017   UIBC 208 06/13/2017   IRONPCTSAT 33 06/13/2017   Lab Results  Component Value Date   RBC 4.18 06/13/2017   No results found for: KPAFRELGTCHN, LAMBDASER, KAPLAMBRATIO No results found for: IGGSERUM, IGA, IGMSERUM No results found for: Dorene Ar, A1GS, A2GS, Karn Pickler, SPEI   Chemistry      Component Value Date/Time   NA 138 06/24/2014 1015   K 3.8 06/24/2014 1015   CL 103 06/24/2014 1015   CO2 26 06/24/2014 1015   BUN 8 06/24/2014 1015   CREATININE 0.97 06/24/2014 1015   CREATININE 0.86 12/12/2013 1654      Component Value Date/Time   CALCIUM 8.9 06/24/2014 1015   ALKPHOS 40 06/24/2014 1015   AST 19 06/24/2014 1015   ALT 14 06/24/2014 1015  BILITOT 0.4 06/24/2014 1015      Impression and Plan: Rose Gonzalez is a very pleasant 41 yo African American female with iron deficiency anemia secondary to malabsorption after gastric sleeve procedure. She responded nicely to the 2 doses of IV iron she received in May and her counts are still stable. No infusion needed this visit.  We will plan to see her again in another 3 months for repeat lab work and follow-up.  She will contact our office with any questions or concerns. We can certainly see her sooner if need be.   Verdie MosherINCINNATI,SARAH M, NP 9/12/20189:05 AM

## 2017-09-12 ENCOUNTER — Other Ambulatory Visit: Payer: BLUE CROSS/BLUE SHIELD

## 2017-09-13 ENCOUNTER — Ambulatory Visit: Payer: BLUE CROSS/BLUE SHIELD | Admitting: Family

## 2017-09-20 DIAGNOSIS — M9903 Segmental and somatic dysfunction of lumbar region: Secondary | ICD-10-CM | POA: Diagnosis not present

## 2017-09-20 DIAGNOSIS — M9905 Segmental and somatic dysfunction of pelvic region: Secondary | ICD-10-CM | POA: Diagnosis not present

## 2017-09-20 DIAGNOSIS — M4126 Other idiopathic scoliosis, lumbar region: Secondary | ICD-10-CM | POA: Diagnosis not present

## 2017-09-20 DIAGNOSIS — M9904 Segmental and somatic dysfunction of sacral region: Secondary | ICD-10-CM | POA: Diagnosis not present

## 2017-09-21 DIAGNOSIS — M4126 Other idiopathic scoliosis, lumbar region: Secondary | ICD-10-CM | POA: Diagnosis not present

## 2017-09-21 DIAGNOSIS — M9904 Segmental and somatic dysfunction of sacral region: Secondary | ICD-10-CM | POA: Diagnosis not present

## 2017-09-21 DIAGNOSIS — M9905 Segmental and somatic dysfunction of pelvic region: Secondary | ICD-10-CM | POA: Diagnosis not present

## 2017-09-21 DIAGNOSIS — M9903 Segmental and somatic dysfunction of lumbar region: Secondary | ICD-10-CM | POA: Diagnosis not present

## 2017-09-28 ENCOUNTER — Ambulatory Visit: Payer: BLUE CROSS/BLUE SHIELD | Admitting: Skilled Nursing Facility1

## 2017-11-14 DIAGNOSIS — Z13220 Encounter for screening for lipoid disorders: Secondary | ICD-10-CM | POA: Diagnosis not present

## 2017-11-14 DIAGNOSIS — Z0000 Encounter for general adult medical examination without abnormal findings: Secondary | ICD-10-CM | POA: Diagnosis not present

## 2017-12-22 DIAGNOSIS — R079 Chest pain, unspecified: Secondary | ICD-10-CM | POA: Diagnosis not present

## 2017-12-22 DIAGNOSIS — Z9884 Bariatric surgery status: Secondary | ICD-10-CM | POA: Diagnosis not present

## 2017-12-22 DIAGNOSIS — D510 Vitamin B12 deficiency anemia due to intrinsic factor deficiency: Secondary | ICD-10-CM | POA: Diagnosis not present

## 2017-12-22 DIAGNOSIS — K9089 Other intestinal malabsorption: Secondary | ICD-10-CM | POA: Diagnosis not present

## 2017-12-25 ENCOUNTER — Other Ambulatory Visit: Payer: Self-pay | Admitting: Physician Assistant

## 2017-12-25 ENCOUNTER — Other Ambulatory Visit: Payer: Self-pay | Admitting: Obstetrics and Gynecology

## 2017-12-25 DIAGNOSIS — Z1231 Encounter for screening mammogram for malignant neoplasm of breast: Secondary | ICD-10-CM

## 2017-12-28 ENCOUNTER — Telehealth (HOSPITAL_COMMUNITY): Payer: Self-pay | Admitting: Physician Assistant

## 2018-01-01 ENCOUNTER — Other Ambulatory Visit: Payer: Self-pay | Admitting: Physician Assistant

## 2018-01-01 DIAGNOSIS — R079 Chest pain, unspecified: Secondary | ICD-10-CM

## 2018-01-02 NOTE — Telephone Encounter (Signed)
User: Trina AoGRIFFIN, Tiaira Arambula A Date/time: 12/29/17 9:59 AM  Comment: Called pt and lmsg for her to CB to get sch for an [email or web address]  Context:  Outcome: Left Message  Phone number: [phone] Phone Type: Mobile  Comm. type: Telephone Call type: Outgoing  Contact: Edwyna Readyivers, [place] M Relation to patient: Self    User: Trina AoGRIFFIN, Katora Fini A Date/time: 12/28/17 1:54 PM  Comment: Called pt and lmsg for he r to CB to sch ETT  Context:  Outcome: Left Message  Phone number: [phone] Phone Type: Home Phone  Comm. type: Telephone Call type: Outgoing  Contact: Edwyna ReadyRivers, Ken[place] M Relation to patient: Self

## 2018-01-09 ENCOUNTER — Ambulatory Visit (INDEPENDENT_AMBULATORY_CARE_PROVIDER_SITE_OTHER): Payer: BLUE CROSS/BLUE SHIELD

## 2018-01-09 DIAGNOSIS — R079 Chest pain, unspecified: Secondary | ICD-10-CM

## 2018-01-09 LAB — EXERCISE TOLERANCE TEST
CSEPED: 7 min
CSEPEDS: 30 s
CSEPHR: 103 %
Estimated workload: 9.3 METS
MPHR: 177 {beats}/min
Peak HR: 184 {beats}/min
RPE: 14
Rest HR: 89 {beats}/min

## 2018-01-11 ENCOUNTER — Other Ambulatory Visit: Payer: Self-pay | Admitting: Obstetrics and Gynecology

## 2018-01-11 ENCOUNTER — Other Ambulatory Visit (HOSPITAL_COMMUNITY)
Admission: RE | Admit: 2018-01-11 | Discharge: 2018-01-11 | Disposition: A | Discharge status: Home / Self Care | Payer: BLUE CROSS/BLUE SHIELD | Source: Ambulatory Visit | Attending: Obstetrics and Gynecology | Admitting: Obstetrics and Gynecology

## 2018-01-11 DIAGNOSIS — Z01411 Encounter for gynecological examination (general) (routine) with abnormal findings: Secondary | ICD-10-CM | POA: Insufficient documentation

## 2018-01-11 DIAGNOSIS — N920 Excessive and frequent menstruation with regular cycle: Secondary | ICD-10-CM | POA: Diagnosis not present

## 2018-01-15 LAB — CYTOLOGY - PAP
Diagnosis: NEGATIVE
HPV (WINDOPATH): NOT DETECTED

## 2018-02-06 ENCOUNTER — Ambulatory Visit: Payer: BLUE CROSS/BLUE SHIELD

## 2018-02-12 ENCOUNTER — Encounter: Payer: Self-pay | Admitting: Skilled Nursing Facility1

## 2018-02-12 ENCOUNTER — Encounter: Payer: BLUE CROSS/BLUE SHIELD | Attending: General Surgery | Admitting: Skilled Nursing Facility1

## 2018-02-12 ENCOUNTER — Ambulatory Visit
Admission: RE | Admit: 2018-02-12 | Discharge: 2018-02-12 | Disposition: A | Discharge status: Home / Self Care | Payer: BLUE CROSS/BLUE SHIELD | Source: Ambulatory Visit | Attending: Obstetrics and Gynecology | Admitting: Obstetrics and Gynecology

## 2018-02-12 DIAGNOSIS — N920 Excessive and frequent menstruation with regular cycle: Secondary | ICD-10-CM | POA: Diagnosis not present

## 2018-02-12 DIAGNOSIS — Z1231 Encounter for screening mammogram for malignant neoplasm of breast: Secondary | ICD-10-CM

## 2018-02-12 DIAGNOSIS — Z713 Dietary counseling and surveillance: Secondary | ICD-10-CM | POA: Insufficient documentation

## 2018-02-12 DIAGNOSIS — Z9884 Bariatric surgery status: Secondary | ICD-10-CM | POA: Insufficient documentation

## 2018-02-12 DIAGNOSIS — E785 Hyperlipidemia, unspecified: Secondary | ICD-10-CM

## 2018-02-12 NOTE — Progress Notes (Signed)
  Primary concerns today: Post-operative Bariatric Surgery Nutrition Management.  Pt states she has gained some weight: 12 pounds from lowest weight so from 2016-2018 gaining 12 pounds. Pt had the sleeve gastrectomy in 2015. Pt states she has been needing blood infusions. Pt states her teeth have always been healthy until after the surgery. Pt states she has a bowel movement every other day and using miralax every day. Pt states she does not like to take pills and will not take multivitamin capsules.   Pt states she has gained weight and does admit to having a lot more stress in her life and stress eating but not really realizing it. Pt states she has been pooping every day and walking more but she has still gained weight: pt thinking if certain behaviors such was having a bowel movement every day does not reauslt in weight loss or weight maintainance then what is the point? Dietitian educated the pt on the positive impacts of having a bowel movement as well as 150 minutes of physical activity a week.   TANITA  BODY COMP RESULTS  05/29/2017 02/12/2018   BMI (kg/m^2) 29.37 30.6   Fat Mass (lbs) 68.6 94.6   Fat Free Mass (lbs) 142 124.6   Total Body Water (lbs) 102.2 90.2   24-hr recall: Lightest meal at dinner  B (AM):coffee with creamer Snk (AM):   L (PM): Fruit smoothie with kale with salad and protein drink Snk (PM):  fruit D (PM):  [place] meatloaf mashed potatoes and green beans OR pizza (every Friday) OR chicken and cabbage  Snk (PM): fruit  Fluid intake: coffee with sugary creamer, water 48 ounces of water Estimated total protein intake: 80+  Medications: see list  Supplementation: powdered multivitamin   Using straws: no Drinking while eating: no Having you been chewing well:no Chewing/swallowing difficulties: no Changes in vision: no Changes to mood/headaches: no Hair loss/Cahnges to skin/Changes to nails: no Any difficulty focusing or concentrating: no Sweating:  no Dizziness/Lightheaded: no Palpitations: no  Carbonated beverages: no N/V/D/C/GAS: no Abdominal Pain: no  Dumping syndrome: no  Recent physical activity:  10000 steps a day: walking 2 times a day, 4 miles 3 days a week   Progress Towards Goal(s):  In progress.  Intervention:  Nutrition counseling. Dietitian educated the pt on the need for the appropriate supplements as well as enough water. Goals: -Aim for 70 ounces of water a day -Write down everything you eat to calculate your calories and link it to your weight -Find another coping mechanism for your stress -Work with your colleagues doing the same work to work on Theme park manager Method Utilized:  Scientific laboratory technician Hands on  Barriers to learning/adherence to lifestyle change: unsure  Demonstrated degree of understanding via:  Teach Back   Monitoring/Evaluation:  Dietary intake, exercise, and body weight.

## 2018-02-13 ENCOUNTER — Ambulatory Visit: Payer: BLUE CROSS/BLUE SHIELD

## 2018-02-13 ENCOUNTER — Ambulatory Visit: Payer: BLUE CROSS/BLUE SHIELD | Admitting: Skilled Nursing Facility1

## 2018-03-21 ENCOUNTER — Ambulatory Visit: Admit: 2018-03-21 | Payer: BLUE CROSS/BLUE SHIELD | Admitting: Obstetrics and Gynecology

## 2018-03-21 SURGERY — DILATATION & CURETTAGE/HYSTEROSCOPY WITH HYDROTHERMAL ABLATION
Anesthesia: Choice

## 2018-03-26 DIAGNOSIS — E7800 Pure hypercholesterolemia, unspecified: Secondary | ICD-10-CM | POA: Diagnosis not present

## 2018-05-29 DIAGNOSIS — Z903 Acquired absence of stomach [part of]: Secondary | ICD-10-CM | POA: Diagnosis not present

## 2018-08-01 DIAGNOSIS — E7800 Pure hypercholesterolemia, unspecified: Secondary | ICD-10-CM | POA: Diagnosis not present

## 2018-08-21 DIAGNOSIS — D219 Benign neoplasm of connective and other soft tissue, unspecified: Secondary | ICD-10-CM | POA: Diagnosis not present

## 2018-10-16 ENCOUNTER — Encounter (INDEPENDENT_AMBULATORY_CARE_PROVIDER_SITE_OTHER): Payer: Self-pay

## 2018-10-23 DIAGNOSIS — R509 Fever, unspecified: Secondary | ICD-10-CM | POA: Diagnosis not present

## 2018-10-23 DIAGNOSIS — R05 Cough: Secondary | ICD-10-CM | POA: Diagnosis not present

## 2018-10-25 ENCOUNTER — Encounter (INDEPENDENT_AMBULATORY_CARE_PROVIDER_SITE_OTHER): Payer: Self-pay | Admitting: Bariatrics

## 2018-10-25 ENCOUNTER — Encounter (INDEPENDENT_AMBULATORY_CARE_PROVIDER_SITE_OTHER): Payer: BLUE CROSS/BLUE SHIELD | Admitting: Bariatrics

## 2018-10-25 ENCOUNTER — Encounter (INDEPENDENT_AMBULATORY_CARE_PROVIDER_SITE_OTHER): Payer: Self-pay

## 2018-10-25 VITALS — Temp 97.6°F

## 2018-10-30 NOTE — Progress Notes (Signed)
This encounter was created in error - please disregard.

## 2018-11-08 ENCOUNTER — Ambulatory Visit (INDEPENDENT_AMBULATORY_CARE_PROVIDER_SITE_OTHER): Payer: Self-pay | Admitting: Bariatrics

## 2018-11-16 DIAGNOSIS — Z0000 Encounter for general adult medical examination without abnormal findings: Secondary | ICD-10-CM | POA: Diagnosis not present

## 2018-11-16 DIAGNOSIS — E7800 Pure hypercholesterolemia, unspecified: Secondary | ICD-10-CM | POA: Diagnosis not present

## 2019-03-04 ENCOUNTER — Encounter (HOSPITAL_BASED_OUTPATIENT_CLINIC_OR_DEPARTMENT_OTHER): Payer: Self-pay | Admitting: Emergency Medicine

## 2019-03-04 ENCOUNTER — Other Ambulatory Visit: Payer: Self-pay

## 2019-03-04 ENCOUNTER — Emergency Department (HOSPITAL_BASED_OUTPATIENT_CLINIC_OR_DEPARTMENT_OTHER)
Admission: EM | Admit: 2019-03-04 | Discharge: 2019-03-04 | Disposition: A | Discharge status: Home / Self Care | Payer: BLUE CROSS/BLUE SHIELD | Attending: Emergency Medicine | Admitting: Emergency Medicine

## 2019-03-04 DIAGNOSIS — S6991XA Unspecified injury of right wrist, hand and finger(s), initial encounter: Secondary | ICD-10-CM | POA: Diagnosis not present

## 2019-03-04 DIAGNOSIS — Y999 Unspecified external cause status: Secondary | ICD-10-CM | POA: Diagnosis not present

## 2019-03-04 DIAGNOSIS — Y93G3 Activity, cooking and baking: Secondary | ICD-10-CM | POA: Diagnosis not present

## 2019-03-04 DIAGNOSIS — Y929 Unspecified place or not applicable: Secondary | ICD-10-CM | POA: Insufficient documentation

## 2019-03-04 DIAGNOSIS — T23131A Burn of first degree of multiple right fingers (nail), not including thumb, initial encounter: Secondary | ICD-10-CM | POA: Diagnosis not present

## 2019-03-04 DIAGNOSIS — Z79899 Other long term (current) drug therapy: Secondary | ICD-10-CM | POA: Insufficient documentation

## 2019-03-04 DIAGNOSIS — I10 Essential (primary) hypertension: Secondary | ICD-10-CM | POA: Insufficient documentation

## 2019-03-04 DIAGNOSIS — Z23 Encounter for immunization: Secondary | ICD-10-CM | POA: Insufficient documentation

## 2019-03-04 DIAGNOSIS — T23121A Burn of first degree of single right finger (nail) except thumb, initial encounter: Secondary | ICD-10-CM | POA: Diagnosis not present

## 2019-03-04 DIAGNOSIS — T23129A Burn of first degree of unspecified single finger (nail) except thumb, initial encounter: Secondary | ICD-10-CM

## 2019-03-04 DIAGNOSIS — X19XXXA Contact with other heat and hot substances, initial encounter: Secondary | ICD-10-CM | POA: Insufficient documentation

## 2019-03-04 MED ORDER — ACETAMINOPHEN 500 MG PO TABS
1000.0000 mg | ORAL_TABLET | Freq: Once | ORAL | Status: AC
  Administered 2019-03-04: 1000 mg via ORAL
  Filled 2019-03-04: qty 2

## 2019-03-04 MED ORDER — TETANUS-DIPHTH-ACELL PERTUSSIS 5-2.5-18.5 LF-MCG/0.5 IM SUSP
0.5000 mL | Freq: Once | INTRAMUSCULAR | Status: AC
  Administered 2019-03-04: 0.5 mL via INTRAMUSCULAR
  Filled 2019-03-04: qty 0.5

## 2019-03-04 MED ORDER — SILVER SULFADIAZINE 1 % EX CREA
TOPICAL_CREAM | Freq: Once | CUTANEOUS | Status: AC
  Administered 2019-03-04: via TOPICAL
  Filled 2019-03-04: qty 85

## 2019-03-04 MED ORDER — IBUPROFEN 800 MG PO TABS
800.0000 mg | ORAL_TABLET | Freq: Once | ORAL | Status: AC
  Administered 2019-03-04: 800 mg via ORAL
  Filled 2019-03-04: qty 1

## 2019-03-04 NOTE — ED Provider Notes (Signed)
[facility] EMERGENCY DEPARTMENT Provider Note   CSN: [id number] Arrival date & time: 03/04/19  2314    History   Chief Complaint Chief Complaint  Patient presents with  . Hand Burn    HPI Rose Gonzalez is a 43 y.o. female.     The history is provided by the patient.  Burn  Burn location:  Hand Hand burn location:  R fingers Burn quality:  Painful Time since incident:  3 hours Progression:  Unchanged Pain details:    Severity:  Moderate   Timing:  Constant   Progression:  Unchanged Mechanism of burn:  Steam Incident location:  Home Relieved by:  Nothing Worsened by:  Nothing Ineffective treatments:  None tried Associated symptoms: no cough and no shortness of breath   Tetanus status:  Unknown Steam burn first degree PIP to the DIP of the right dorsal index finger.    Past Medical History:  Diagnosis Date  . Anemia    with pregnancy   . Hyperlipidemia   . Hypertension    was on htn medss- pt took herself off of - none since 01/2014.      Patient Active Problem List   Diagnosis Date Noted  . Iron deficiency anemia 02/01/2017  . Malabsorption of iron 02/01/2017  . Morbid obesity (HCC) 12/12/2013  . Dyslipidemia 12/12/2013  . Pain in joint, lower leg 12/12/2013    Past Surgical History:  Procedure Laterality Date  . LAPAROSCOPIC GASTRIC SLEEVE RESECTION N/A 06/24/2014   Procedure: LAPAROSCOPIC GASTRIC SLEEVE RESECTION;  Surgeon: Glenna Fellows, MD;  Location: [facility] ORS;  Service: General;  Laterality: N/A;  . NO PAST SURGERIES       OB History   No obstetric history on file.      Home Medications    Prior to Admission medications   Medication Sig Start Date End Date Taking? Authorizing Provider  Multiple Vitamin (MULTIVITAMIN WITH MINERALS) TABS tablet Take 1 tablet by mouth every morning.    [provider]    Family History No family history on file.  Social History Social History   Tobacco Use  . Smoking status:  Never Smoker  . Smokeless tobacco: Never Used  Substance Use Topics  . Alcohol use: Yes    Comment: occasional   . Drug use: No     Allergies   Patient has no known allergies.   Review of Systems Review of Systems  Constitutional: Negative for fever.  Respiratory: Negative for cough and shortness of breath.   Cardiovascular: Negative for chest pain.  Skin: Negative for wound.  All other systems reviewed and are negative.    Physical Exam Updated Vital Signs BP (!) 117/91 (BP Location: Right Arm)   Pulse 77   Temp (!) 97.5 F (36.4 C) (Oral)   Resp 18   Ht 6' (1.829 m)   Wt 99 kg   LMP 02/20/2019   SpO2 100%   BMI 29.60 kg/m   Physical Exam Vitals signs reviewed.  Constitutional:      General: She is not in acute distress.    Appearance: She is normal weight.  HENT:     Head: Normocephalic and atraumatic.     Nose: Nose normal.  Eyes:     Conjunctiva/sclera: Conjunctivae normal.     Pupils: Pupils are equal, round, and reactive to light.  Neck:     Musculoskeletal: Normal range of motion and neck supple.  Cardiovascular:     Rate and Rhythm: Normal rate  and regular rhythm.     Pulses: Normal pulses.     Heart sounds: Normal heart sounds.  Pulmonary:     Effort: Pulmonary effort is normal.     Breath sounds: Normal breath sounds.  Abdominal:     General: Abdomen is flat. Bowel sounds are normal.     Tenderness: There is no abdominal tenderness.  Musculoskeletal: Normal range of motion.  Skin:    General: Skin is warm and dry.     Capillary Refill: Capillary refill takes less than 2 seconds.     Findings: No erythema or rash.  Neurological:     General: No focal deficit present.     Mental Status: She is alert and oriented to person, place, and time.  Psychiatric:        Mood and Affect: Mood normal.        Behavior: Behavior normal.      ED Treatments / Results  Labs (all labs ordered are listed, but only abnormal results are displayed) Labs  Reviewed - No data to display  EKG None  Radiology No results found.  Procedures Procedures (including critical care time)  Medications Ordered in ED Medications  Tdap (BOOSTRIX) injection 0.5 mL (has no administration in time range)  acetaminophen (TYLENOL) tablet 1,000 mg (has no administration in time range)  ibuprofen (ADVIL) tablet 800 mg (has no administration in time range)  silver sulfADIAZINE (SILVADENE) 1 % cream (has no administration in time range)     Final Clinical Impressions(s) / ED Diagnoses   Return for intractable cough, coughing up blood,fevers >100.4 unrelieved by medication, shortness of breath, intractable vomiting, chest pain, shortness of breath, weakness,numbness, changes in speech, facial asymmetry,abdominal pain, passing out,Inability to tolerate liquids or food, cough, altered mental status or any concerns. No signs of systemic illness or infection. The patient is nontoxic-appearing on exam and vital signs are within normal limits.   I have reviewed the triage vital signs and the nursing notes. Pertinent labs &imaging results that were available during my care of the patient were reviewed by me and considered in my medical decision making (see chart for details).  After history, exam, and medical workup I feel the patient has been appropriately medically screened and is safe for discharge home. Pertinent diagnoses were discussed with the patient. Patient was given return precautions   Jhamari Markowicz, MD 03/04/19 2329

## 2019-03-04 NOTE — ED Triage Notes (Signed)
Patient burned the top left index finger on a pot on the stove; blistered area noted.

## 2019-03-04 NOTE — ED Notes (Signed)
DSD with silvadene cream  to Left 2nd digit burn.

## 2019-03-04 NOTE — Discharge Instructions (Signed)
Silvadene two times daily

## 2019-09-10 DIAGNOSIS — D510 Vitamin B12 deficiency anemia due to intrinsic factor deficiency: Secondary | ICD-10-CM | POA: Diagnosis not present

## 2019-09-10 DIAGNOSIS — D509 Iron deficiency anemia, unspecified: Secondary | ICD-10-CM | POA: Diagnosis not present

## 2019-09-10 DIAGNOSIS — K909 Intestinal malabsorption, unspecified: Secondary | ICD-10-CM | POA: Diagnosis not present

## 2019-09-11 DIAGNOSIS — D510 Vitamin B12 deficiency anemia due to intrinsic factor deficiency: Secondary | ICD-10-CM | POA: Diagnosis not present

## 2019-09-12 ENCOUNTER — Other Ambulatory Visit: Payer: Self-pay

## 2019-09-13 ENCOUNTER — Telehealth: Payer: Self-pay | Admitting: Family

## 2019-09-13 NOTE — Telephone Encounter (Signed)
lmom to inform patient of appt per referral on 12/30 at 1:30 pm. Informed patient of office number to call of she needs to resch appts

## 2019-10-01 ENCOUNTER — Other Ambulatory Visit: Payer: Self-pay | Admitting: Family

## 2019-10-01 DIAGNOSIS — D508 Other iron deficiency anemias: Secondary | ICD-10-CM

## 2019-10-01 DIAGNOSIS — K909 Intestinal malabsorption, unspecified: Secondary | ICD-10-CM

## 2019-10-02 ENCOUNTER — Other Ambulatory Visit: Payer: Self-pay

## 2019-10-02 ENCOUNTER — Inpatient Hospital Stay: Payer: BC Managed Care – PPO

## 2019-10-02 ENCOUNTER — Inpatient Hospital Stay: Payer: BC Managed Care – PPO | Attending: Family | Admitting: Family

## 2019-10-02 ENCOUNTER — Encounter: Payer: Self-pay | Admitting: Family

## 2019-10-02 VITALS — BP 122/77 | HR 88 | Temp 97.2°F | Resp 18 | Ht 72.0 in | Wt 218.8 lb

## 2019-10-02 DIAGNOSIS — K912 Postsurgical malabsorption, not elsewhere classified: Secondary | ICD-10-CM | POA: Insufficient documentation

## 2019-10-02 DIAGNOSIS — K909 Intestinal malabsorption, unspecified: Secondary | ICD-10-CM

## 2019-10-02 DIAGNOSIS — D508 Other iron deficiency anemias: Secondary | ICD-10-CM

## 2019-10-02 DIAGNOSIS — Z9884 Bariatric surgery status: Secondary | ICD-10-CM | POA: Diagnosis not present

## 2019-10-02 LAB — CBC WITH DIFFERENTIAL (CANCER CENTER ONLY)
Abs Immature Granulocytes: 0.02 10*3/uL (ref 0.00–0.07)
Basophils Absolute: 0 10*3/uL (ref 0.0–0.1)
Basophils Relative: 1 %
Eosinophils Absolute: 0.1 10*3/uL (ref 0.0–0.5)
Eosinophils Relative: 2 %
HCT: 29.1 % — ABNORMAL LOW (ref 36.0–46.0)
Hemoglobin: 8.7 g/dL — ABNORMAL LOW (ref 12.0–15.0)
Immature Granulocytes: 1 %
Lymphocytes Relative: 33 %
Lymphs Abs: 1.4 10*3/uL (ref 0.7–4.0)
MCH: 22.4 pg — ABNORMAL LOW (ref 26.0–34.0)
MCHC: 29.9 g/dL — ABNORMAL LOW (ref 30.0–36.0)
MCV: 75 fL — ABNORMAL LOW (ref 80.0–100.0)
Monocytes Absolute: 0.5 10*3/uL (ref 0.1–1.0)
Monocytes Relative: 12 %
Neutro Abs: 2.3 10*3/uL (ref 1.7–7.7)
Neutrophils Relative %: 51 %
Platelet Count: 307 10*3/uL (ref 150–400)
RBC: 3.88 MIL/uL (ref 3.87–5.11)
RDW: 17.5 % — ABNORMAL HIGH (ref 11.5–15.5)
WBC Count: 4.3 10*3/uL (ref 4.0–10.5)
nRBC: 0 % (ref 0.0–0.2)

## 2019-10-02 LAB — RETICULOCYTES
Immature Retic Fract: 14.5 % (ref 2.3–15.9)
RBC.: 3.85 MIL/uL — ABNORMAL LOW (ref 3.87–5.11)
Retic Count, Absolute: 33.1 10*3/uL (ref 19.0–186.0)
Retic Ct Pct: 0.9 % (ref 0.4–3.1)

## 2019-10-02 NOTE — Progress Notes (Signed)
Hematology and Oncology Follow Up Visit  KATILYN MILTENBERGER [id number] December 04, 1975 43 y.o. 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 Nix is here today for follow-up. We last saw her in September 2018. She is symptomatic with fatigue, numbness and tingling in fingertips and toes and chewing lots of ice. No episodes of bleeding. No bruising or petechiae.  No fever, chills, n/v, cough, rash, dizziness, SOB, chest pain, palpitations, abdominal pain or changes in bowel or bladder habits.  No swelling, tenderness, numbness or tingling in her extremities.  No falls or syncope.  She has maintained a good appetite and is staying well hydrated. Her weight is stable.   ECOG Performance Status: 1 - Symptomatic but completely ambulatory  Medications:  Allergies as of 10/02/2019   No Known Allergies     Medication List       Accurate as of October 02, 2019  2:17 PM. If you have any questions, ask your nurse or doctor.        multivitamin with minerals Tabs tablet Take 1 tablet by mouth every morning.       Allergies: No Known Allergies  Past Medical History, Surgical history, Social history, and Family History were reviewed and updated.  Review of Systems: All other 10 point review of systems is negative.   Physical Exam:  height is 6' (1.829 m) and weight is 218 lb 12.8 oz (99.2 kg). Her temporal temperature is 97.2 F (36.2 C) (abnormal). Her blood pressure is 122/77 and her pulse is 88. Her respiration is 18 and oxygen saturation is 100%.   Wt Readings from Last 3 Encounters:  10/02/19 218 lb 12.8 oz (99.2 kg)  03/04/19 218 lb 4.1 oz (99 kg)  02/12/18 219 lb (99.3 kg)    Ocular: Sclerae unicteric, pupils equal, round and reactive to light Ear-nose-throat: Oropharynx clear, dentition fair Lymphatic: No cervical or supraclavicular  adenopathy Lungs no rales or rhonchi, good excursion bilaterally Heart regular rate and rhythm, no murmur appreciated Abd soft, nontender, positive bowel sounds, no liver or spleen tip palpated on exam, no fluid wave MSK no focal spinal tenderness, no joint edema Neuro: non-focal, well-oriented, appropriate affect Breasts: Deferred   Lab Results  Component Value Date   WBC 4.3 10/02/2019   HGB 8.7 (L) 10/02/2019   HCT 29.1 (L) 10/02/2019   MCV 75.0 (L) 10/02/2019   PLT 307 10/02/2019   Lab Results  Component Value Date   FERRITIN 11 06/13/2017   IRON 104 06/13/2017   TIBC 313 06/13/2017   UIBC 208 06/13/2017   IRONPCTSAT 33 06/13/2017   Lab Results  Component Value Date   RETICCTPCT 0.9 10/02/2019   RBC 3.85 (L) 10/02/2019   No results found for: KPAFRELGTCHN, LAMBDASER, KAPLAMBRATIO No results found for: IGGSERUM, IGA, IGMSERUM No results found for: Dorene Ar, A1GS, A2GS, Colin Benton, MSPIKE, SPEI   Chemistry      Component Value Date/Time   NA 138 06/24/2014 1015   K 3.8 06/24/2014 1015   CL 103 06/24/2014 1015   CO2 26 06/24/2014 1015   BUN 8 06/24/2014 1015   CREATININE 0.97 06/24/2014 1015   CREATININE 0.86 12/12/2013 1654      Component Value Date/Time   CALCIUM 8.9 06/24/2014 1015   ALKPHOS 40 06/24/2014 1015   AST 19 06/24/2014 1015   ALT 14 06/24/2014 1015  BILITOT 0.4 06/24/2014 1015       Impression and Plan: Ms. Axelson is a very pleasant 43 yo African American female with iron deficiency anemia secondary to malabsorption after gastric sleeve procedure.  Hgb is down to 8.7 and symptomatic as mentioned above.   We will see what her iron studies show and get her set up for Venofer infusions.  We will plan to see her back in another 8 weeks for follow-up.  She will contact our office with any questions or concerns. We can certainly see her sooner if needed.   Laverna Peace, NP 12/30/20202:17 PM

## 2019-10-03 ENCOUNTER — Other Ambulatory Visit: Payer: Self-pay | Admitting: Family

## 2019-10-03 LAB — IRON AND TIBC
Iron: 16 ug/dL — ABNORMAL LOW (ref 41–142)
Saturation Ratios: 4 % — ABNORMAL LOW (ref 21–57)
TIBC: 380 ug/dL (ref 236–444)
UIBC: 364 ug/dL (ref 120–384)

## 2019-10-03 LAB — FERRITIN: Ferritin: <4 ng/mL — ABNORMAL LOW (ref 11–307)

## 2019-10-07 ENCOUNTER — Other Ambulatory Visit: Payer: Self-pay

## 2019-10-07 ENCOUNTER — Inpatient Hospital Stay: Payer: BC Managed Care – PPO | Attending: Family

## 2019-10-07 ENCOUNTER — Telehealth: Payer: Self-pay | Admitting: Hematology & Oncology

## 2019-10-07 VITALS — BP 123/73 | HR 71 | Temp 97.5°F | Resp 17

## 2019-10-07 DIAGNOSIS — Z79899 Other long term (current) drug therapy: Secondary | ICD-10-CM | POA: Diagnosis not present

## 2019-10-07 DIAGNOSIS — D508 Other iron deficiency anemias: Secondary | ICD-10-CM | POA: Insufficient documentation

## 2019-10-07 DIAGNOSIS — K909 Intestinal malabsorption, unspecified: Secondary | ICD-10-CM

## 2019-10-07 MED ORDER — SODIUM CHLORIDE 0.9 % IV SOLN
Freq: Once | INTRAVENOUS | Status: AC
  Filled 2019-10-07: qty 250

## 2019-10-07 MED ORDER — SODIUM CHLORIDE 0.9 % IV SOLN
200.0000 mg | Freq: Once | INTRAVENOUS | Status: AC
  Administered 2019-10-07: 200 mg via INTRAVENOUS
  Filled 2019-10-07: qty 10

## 2019-10-07 NOTE — Patient Instructions (Signed)

## 2019-10-07 NOTE — Telephone Encounter (Signed)
Called 12/31 regarding appts added per

## 2019-10-10 ENCOUNTER — Telehealth: Payer: Self-pay | Admitting: Hematology & Oncology

## 2019-10-10 NOTE — Telephone Encounter (Signed)
Called and LMVM regarding moving appt for 1/11 out due to CAP in infusion. She will need to speak w/ Vonte at tomorrow's appointment

## 2019-10-11 ENCOUNTER — Other Ambulatory Visit: Payer: Self-pay

## 2019-10-11 ENCOUNTER — Inpatient Hospital Stay: Payer: BC Managed Care – PPO

## 2019-10-11 VITALS — BP 108/67 | HR 74 | Temp 97.7°F | Resp 17

## 2019-10-11 DIAGNOSIS — D508 Other iron deficiency anemias: Secondary | ICD-10-CM

## 2019-10-11 DIAGNOSIS — K909 Intestinal malabsorption, unspecified: Secondary | ICD-10-CM

## 2019-10-11 DIAGNOSIS — Z79899 Other long term (current) drug therapy: Secondary | ICD-10-CM | POA: Diagnosis not present

## 2019-10-11 MED ORDER — SODIUM CHLORIDE 0.9 % IV SOLN
Freq: Once | INTRAVENOUS | Status: AC
  Filled 2019-10-11: qty 250

## 2019-10-11 MED ORDER — SODIUM CHLORIDE 0.9 % IV SOLN
200.0000 mg | Freq: Once | INTRAVENOUS | Status: AC
  Administered 2019-10-11: 200 mg via INTRAVENOUS
  Filled 2019-10-11: qty 10

## 2019-10-11 NOTE — Patient Instructions (Signed)

## 2019-10-14 ENCOUNTER — Inpatient Hospital Stay: Payer: BC Managed Care – PPO

## 2019-10-18 ENCOUNTER — Inpatient Hospital Stay: Payer: BC Managed Care – PPO

## 2019-10-18 ENCOUNTER — Other Ambulatory Visit: Payer: Self-pay

## 2019-10-18 VITALS — BP 113/68 | HR 78 | Temp 97.1°F | Resp 17

## 2019-10-18 DIAGNOSIS — D508 Other iron deficiency anemias: Secondary | ICD-10-CM

## 2019-10-18 DIAGNOSIS — Z79899 Other long term (current) drug therapy: Secondary | ICD-10-CM | POA: Diagnosis not present

## 2019-10-18 DIAGNOSIS — K909 Intestinal malabsorption, unspecified: Secondary | ICD-10-CM

## 2019-10-18 MED ORDER — SODIUM CHLORIDE 0.9 % IV SOLN
200.0000 mg | Freq: Once | INTRAVENOUS | Status: AC
  Administered 2019-10-18: 200 mg via INTRAVENOUS
  Filled 2019-10-18: qty 10

## 2019-10-18 MED ORDER — SODIUM CHLORIDE 0.9 % IV SOLN
Freq: Once | INTRAVENOUS | Status: AC
  Filled 2019-10-18: qty 250

## 2019-10-18 NOTE — Progress Notes (Signed)
Pt. Refused to wait 30 min post infusion. Released stable and ASX. 

## 2019-10-18 NOTE — Patient Instructions (Signed)
r Iron Sucrose injection What is this medicine? IRON SUCROSE (AHY ern SOO krohs) is an iron complex. Iron is used to make healthy red blood cells, which carry oxygen and nutrients throughout the body. This medicine is used to treat iron deficiency anemia in people with chronic kidney disease. This medicine may be used for other purposes; ask your health care provider or pharmacist if you have questions. COMMON BRAND NAME(S): Venofer What should I tell my health care provider before I take this medicine? They need to know if you have any of these conditions:  anemia not caused by low iron levels  heart disease  high levels of iron in the blood  kidney disease  liver disease  an unusual or allergic reaction to iron, other medicines, foods, dyes, or preservatives  pregnant or trying to get pregnant  breast-feeding How should I use this medicine? This medicine is for infusion into a vein. It is given by a health care professional in a hospital or clinic setting. Talk to your pediatrician regarding the use of this medicine in children. While this drug may be prescribed for children as young as 2 years for selected conditions, precautions do apply. Overdosage: If you think you have taken too much of this medicine contact a poison control center or emergency room at once. NOTE: This medicine is only for you. Do not share this medicine with others. What if I miss a dose? It is important not to miss your dose. Call your doctor or health care professional if you are unable to keep an appointment. What may interact with this medicine? Do not take this medicine with any of the following medications:  deferoxamine  dimercaprol  other iron products This medicine may also interact with the following medications:  chloramphenicol  deferasirox This list may not describe all possible interactions. Give your health care provider a list of all the medicines, herbs, non-prescription drugs, or  dietary supplements you use. Also tell them if you smoke, drink alcohol, or use illegal drugs. Some items may interact with your medicine. What should I watch for while using this medicine? Visit your doctor or healthcare professional regularly. Tell your doctor or healthcare professional if your symptoms do not start to get better or if they get worse. You may need blood work done while you are taking this medicine. You may need to follow a special diet. Talk to your doctor. Foods that contain iron include: whole grains/cereals, dried fruits, beans, or peas, leafy green vegetables, and organ meats (liver, kidney). What side effects may I notice from receiving this medicine? Side effects that you should report to your doctor or health care professional as soon as possible:  allergic reactions like skin rash, itching or hives, swelling of the face, lips, or tongue  breathing problems  changes in blood pressure  cough  fast, irregular heartbeat  feeling faint or lightheaded, falls  fever or chills  flushing, sweating, or hot feelings  joint or muscle aches/pains  seizures  swelling of the ankles or feet  unusually weak or tired Side effects that usually do not require medical attention (report to your doctor or health care professional if they continue or are bothersome):  diarrhea  feeling achy  headache  irritation at site where injected  nausea, vomiting  stomach upset  tiredness This list may not describe all possible side effects. Call your doctor for medical advice about side effects. You may report side effects to FDA at 1-800-FDA-1088. Where should I  keep my medicine? This drug is given in a hospital or clinic and will not be stored at home. NOTE: This sheet is a summary. It may not cover all possible information. If you have questions about this medicine, talk to your doctor, pharmacist, or health care provider.  2020 Elsevier/Gold Standard (2011-06-30  17:14:35)

## 2019-10-21 ENCOUNTER — Inpatient Hospital Stay: Payer: BC Managed Care – PPO

## 2019-10-21 ENCOUNTER — Other Ambulatory Visit: Payer: Self-pay

## 2019-10-21 VITALS — BP 111/64 | HR 82 | Temp 97.3°F | Resp 17

## 2019-10-21 DIAGNOSIS — D508 Other iron deficiency anemias: Secondary | ICD-10-CM

## 2019-10-21 DIAGNOSIS — Z79899 Other long term (current) drug therapy: Secondary | ICD-10-CM | POA: Diagnosis not present

## 2019-10-21 DIAGNOSIS — K909 Intestinal malabsorption, unspecified: Secondary | ICD-10-CM

## 2019-10-21 MED ORDER — HEPARIN SOD (PORK) LOCK FLUSH 100 UNIT/ML IV SOLN
500.0000 [IU] | Freq: Once | INTRAVENOUS | Status: DC | PRN
  Filled 2019-10-21: qty 5

## 2019-10-21 MED ORDER — HEPARIN SOD (PORK) LOCK FLUSH 100 UNIT/ML IV SOLN
250.0000 [IU] | Freq: Once | INTRAVENOUS | Status: DC | PRN
  Filled 2019-10-21: qty 5

## 2019-10-21 MED ORDER — SODIUM CHLORIDE 0.9 % IV SOLN
Freq: Once | INTRAVENOUS | Status: AC
  Filled 2019-10-21: qty 250

## 2019-10-21 MED ORDER — SODIUM CHLORIDE 0.9 % IV SOLN
200.0000 mg | Freq: Once | INTRAVENOUS | Status: AC
  Administered 2019-10-21: 200 mg via INTRAVENOUS
  Filled 2019-10-21: qty 10

## 2019-10-21 MED ORDER — SODIUM CHLORIDE 0.9% FLUSH
3.0000 mL | Freq: Once | INTRAVENOUS | Status: DC | PRN
  Filled 2019-10-21: qty 10

## 2019-10-21 MED ORDER — ALTEPLASE 2 MG IJ SOLR
2.0000 mg | Freq: Once | INTRAMUSCULAR | Status: DC | PRN
  Filled 2019-10-21: qty 2

## 2019-10-21 MED ORDER — SODIUM CHLORIDE 0.9% FLUSH
10.0000 mL | Freq: Once | INTRAVENOUS | Status: DC | PRN
  Filled 2019-10-21: qty 10

## 2019-10-25 ENCOUNTER — Other Ambulatory Visit: Payer: Self-pay

## 2019-10-25 ENCOUNTER — Inpatient Hospital Stay: Payer: BC Managed Care – PPO

## 2019-10-25 VITALS — BP 119/80 | HR 81 | Temp 97.7°F | Resp 17

## 2019-10-25 DIAGNOSIS — D508 Other iron deficiency anemias: Secondary | ICD-10-CM | POA: Diagnosis not present

## 2019-10-25 DIAGNOSIS — Z79899 Other long term (current) drug therapy: Secondary | ICD-10-CM | POA: Diagnosis not present

## 2019-10-25 DIAGNOSIS — K909 Intestinal malabsorption, unspecified: Secondary | ICD-10-CM

## 2019-10-25 MED ORDER — EPINEPHRINE 0.3 MG/0.3ML IJ SOAJ: 0.3000 mg | Freq: Once | INTRAMUSCULAR | Status: DC | PRN

## 2019-10-25 MED ORDER — ALBUTEROL SULFATE (2.5 MG/3ML) 0.083% IN NEBU
2.5000 mg | INHALATION_SOLUTION | Freq: Once | RESPIRATORY_TRACT | Status: DC | PRN
  Filled 2019-10-25: qty 3

## 2019-10-25 MED ORDER — FAMOTIDINE IN NACL 20-0.9 MG/50ML-% IV SOLN: 20.0000 mg | Freq: Once | INTRAVENOUS | Status: DC | PRN

## 2019-10-25 MED ORDER — SODIUM CHLORIDE 0.9 % IV SOLN
Freq: Once | INTRAVENOUS | Status: AC
  Filled 2019-10-25: qty 250

## 2019-10-25 MED ORDER — SODIUM CHLORIDE 0.9 % IV SOLN
200.0000 mg | Freq: Once | INTRAVENOUS | Status: AC
  Administered 2019-10-25: 200 mg via INTRAVENOUS
  Filled 2019-10-25: qty 10

## 2019-10-25 MED ORDER — DIPHENHYDRAMINE HCL 50 MG/ML IJ SOLN: 50.0000 mg | Freq: Once | INTRAMUSCULAR | Status: DC | PRN

## 2019-10-25 MED ORDER — METHYLPREDNISOLONE SODIUM SUCC 125 MG IJ SOLR: 125.0000 mg | Freq: Once | INTRAMUSCULAR | Status: DC | PRN

## 2019-10-25 MED ORDER — SODIUM CHLORIDE 0.9 % IV SOLN
Freq: Once | INTRAVENOUS | Status: DC | PRN
  Filled 2019-10-25: qty 250

## 2019-10-25 NOTE — Patient Instructions (Signed)

## 2019-10-25 NOTE — Progress Notes (Signed)
Pt declined staying for post infusion observation period. Pt verbalized understanding of allergic reaction and stated she would seek treatment if needed. Pt had no further questions and left the clinic ambulatory

## 2019-11-27 ENCOUNTER — Encounter: Payer: Self-pay | Admitting: Family

## 2019-11-27 ENCOUNTER — Other Ambulatory Visit: Payer: Self-pay

## 2019-11-27 ENCOUNTER — Inpatient Hospital Stay: Payer: BC Managed Care – PPO | Attending: Family | Admitting: Family

## 2019-11-27 ENCOUNTER — Inpatient Hospital Stay: Payer: BC Managed Care – PPO

## 2019-11-27 VITALS — BP 130/87 | HR 85 | Temp 97.7°F | Resp 18 | Ht 72.0 in | Wt 216.1 lb

## 2019-11-27 DIAGNOSIS — R202 Paresthesia of skin: Secondary | ICD-10-CM | POA: Diagnosis not present

## 2019-11-27 DIAGNOSIS — D508 Other iron deficiency anemias: Secondary | ICD-10-CM | POA: Insufficient documentation

## 2019-11-27 DIAGNOSIS — K909 Intestinal malabsorption, unspecified: Secondary | ICD-10-CM

## 2019-11-27 DIAGNOSIS — Z9884 Bariatric surgery status: Secondary | ICD-10-CM | POA: Insufficient documentation

## 2019-11-27 DIAGNOSIS — E538 Deficiency of other specified B group vitamins: Secondary | ICD-10-CM | POA: Diagnosis not present

## 2019-11-27 DIAGNOSIS — R200 Anesthesia of skin: Secondary | ICD-10-CM | POA: Diagnosis not present

## 2019-11-27 LAB — CBC WITH DIFFERENTIAL (CANCER CENTER ONLY)
Abs Immature Granulocytes: 0.03 10*3/uL (ref 0.00–0.07)
Basophils Absolute: 0 10*3/uL (ref 0.0–0.1)
Basophils Relative: 0 %
Eosinophils Absolute: 0.1 10*3/uL (ref 0.0–0.5)
Eosinophils Relative: 1 %
HCT: 37.7 % (ref 36.0–46.0)
Hemoglobin: 12.1 g/dL (ref 12.0–15.0)
Immature Granulocytes: 1 %
Lymphocytes Relative: 31 %
Lymphs Abs: 1.5 10*3/uL (ref 0.7–4.0)
MCH: 27.3 pg (ref 26.0–34.0)
MCHC: 32.1 g/dL (ref 30.0–36.0)
MCV: 84.9 fL (ref 80.0–100.0)
Monocytes Absolute: 0.3 10*3/uL (ref 0.1–1.0)
Monocytes Relative: 7 %
Neutro Abs: 2.9 10*3/uL (ref 1.7–7.7)
Neutrophils Relative %: 60 %
Platelet Count: 236 10*3/uL (ref 150–400)
RBC: 4.44 MIL/uL (ref 3.87–5.11)
RDW: 19.9 % — ABNORMAL HIGH (ref 11.5–15.5)
WBC Count: 4.9 10*3/uL (ref 4.0–10.5)
nRBC: 0 % (ref 0.0–0.2)

## 2019-11-27 LAB — RETICULOCYTES
Immature Retic Fract: 3.8 % (ref 2.3–15.9)
RBC.: 4.39 MIL/uL (ref 3.87–5.11)
Retic Count, Absolute: 35.1 10*3/uL (ref 19.0–186.0)
Retic Ct Pct: 0.8 % (ref 0.4–3.1)

## 2019-11-27 NOTE — Progress Notes (Signed)
Hematology and Oncology Follow Up Visit  LAKE BREEDING [id number] [birth year] 44 y.o. 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 Quizon is here today for follow-up after receiving 5 doses of Venofer. She is doing well but still has the numbness and tingling in the right middle and fourth toes.  She denies fatigue and is staying active walking regularly for exercise.  He Hgb is now 12.1, MCV 84.9.  Her cycle is regular and heavy. No other blood loss noted. No bruising or petechiae.  No fever, chills, n/v, cough, rash, dizziness, SOB, chest pain, palpitations, abdominal pain or changes in bowel or bladder habits.  No swelling or tenderness in her extremities.  She has maintained a good appetite and is staying well hydrated. Her weight is stable.   ECOG Performance Status: 0 - Asymptomatic  Medications:  Allergies as of 11/27/2019   No Known Allergies     Medication List       Accurate as of November 27, 2019  1:40 PM. If you have any questions, ask your nurse or doctor.        multivitamin with minerals Tabs tablet Take 1 tablet by mouth every morning.       Allergies: No Known Allergies  Past Medical History, Surgical history, Social history, and Family History were reviewed and updated.  Review of Systems: All other 10 point review of systems is negative.   Physical Exam:  vitals were not taken for this visit.   Wt Readings from Last 3 Encounters:  10/02/19 218 lb 12.8 oz (99.2 kg)  03/04/19 218 lb 4.1 oz (99 kg)  02/12/18 219 lb (99.3 kg)    Ocular: Sclerae unicteric, pupils equal, round and reactive to light Ear-nose-throat: Oropharynx clear, dentition fair Lymphatic: No cervical or supraclavicular adenopathy Lungs no rales or rhonchi, good excursion bilaterally Heart regular rate and rhythm, no murmur appreciated Abd soft,  nontender, positive bowel sounds, no liver or spleen tip palpated on exam, no fluid wave  MSK no focal spinal tenderness, no joint edema Neuro: non-focal, well-oriented, appropriate affect Breasts: Deferred   Lab Results  Component Value Date   WBC 4.3 10/02/2019   HGB 8.7 (L) 10/02/2019   HCT 29.1 (L) 10/02/2019   MCV 75.0 (L) 10/02/2019   PLT 307 10/02/2019   Lab Results  Component Value Date   FERRITIN <4 (L) 10/02/2019   IRON 16 (L) 10/02/2019   TIBC 380 10/02/2019   UIBC 364 10/02/2019   IRONPCTSAT 4 (L) 10/02/2019   Lab Results  Component Value Date   RETICCTPCT 0.8 11/27/2019   RBC 4.39 11/27/2019   No results found for: KPAFRELGTCHN, LAMBDASER, KAPLAMBRATIO No results found for: IGGSERUM, IGA, IGMSERUM No results found for: Dorene Ar, A1GS, A2GS, Colin Benton, MSPIKE, SPEI   Chemistry      Component Value Date/Time   NA 138 06/24/2014 1015   K 3.8 06/24/2014 1015   CL 103 06/24/2014 1015   CO2 26 06/24/2014 1015   BUN 8 06/24/2014 1015   CREATININE 0.97 06/24/2014 1015   CREATININE 0.86 12/12/2013 1654      Component Value Date/Time   CALCIUM 8.9 06/24/2014 1015   ALKPHOS 40 06/24/2014 1015   AST 19 06/24/2014 1015   ALT 14 06/24/2014 1015   BILITOT 0.4 06/24/2014 1015       Impression and Plan:  Ms. Demonbreun is a very pleasant 44 yo African American female with iron deficiency anemia secondary to malabsorption after gastric sleeve procedure.  She is doing well and has had a nice response to the IV iron but still has the numbness and tingling in 2 toes on her right foot.  She will try taking a B complex vitamin and see if this helps with her symptom.  We will plan to see her back in another 3 months.  She will contact our office with any questions or concerns. We can certainly see her sooner if needed.    Laverna Peace, NP 2/24/20211:40 PM

## 2019-11-28 LAB — IRON AND TIBC
Iron: 72 ug/dL (ref 41–142)
Saturation Ratios: 25 % (ref 21–57)
TIBC: 290 ug/dL (ref 236–444)
UIBC: 218 ug/dL (ref 120–384)

## 2019-11-28 LAB — FERRITIN: Ferritin: 50 ng/mL (ref 11–307)

## 2020-02-27 ENCOUNTER — Other Ambulatory Visit: Payer: Self-pay

## 2020-02-27 ENCOUNTER — Encounter: Payer: Self-pay | Admitting: Hematology & Oncology

## 2020-02-27 ENCOUNTER — Inpatient Hospital Stay: Payer: BC Managed Care – PPO

## 2020-02-27 ENCOUNTER — Inpatient Hospital Stay: Payer: BC Managed Care – PPO | Attending: Family | Admitting: Hematology & Oncology

## 2020-02-27 VITALS — HR 82 | Temp 97.1°F | Resp 20 | Wt 214.0 lb

## 2020-02-27 DIAGNOSIS — D500 Iron deficiency anemia secondary to blood loss (chronic): Secondary | ICD-10-CM | POA: Diagnosis not present

## 2020-02-27 DIAGNOSIS — T148XXA Other injury of unspecified body region, initial encounter: Secondary | ICD-10-CM | POA: Diagnosis not present

## 2020-02-27 DIAGNOSIS — K909 Intestinal malabsorption, unspecified: Secondary | ICD-10-CM | POA: Diagnosis not present

## 2020-02-27 DIAGNOSIS — Z9884 Bariatric surgery status: Secondary | ICD-10-CM | POA: Diagnosis not present

## 2020-02-27 DIAGNOSIS — E538 Deficiency of other specified B group vitamins: Secondary | ICD-10-CM

## 2020-02-27 DIAGNOSIS — D508 Other iron deficiency anemias: Secondary | ICD-10-CM

## 2020-02-27 LAB — CBC WITH DIFFERENTIAL (CANCER CENTER ONLY)
Abs Immature Granulocytes: 0 10*3/uL (ref 0.00–0.07)
Basophils Absolute: 0 10*3/uL (ref 0.0–0.1)
Basophils Relative: 1 %
Eosinophils Absolute: 0.1 10*3/uL (ref 0.0–0.5)
Eosinophils Relative: 2 %
HCT: 37.3 % (ref 36.0–46.0)
Hemoglobin: 12.3 g/dL (ref 12.0–15.0)
Immature Granulocytes: 0 %
Lymphocytes Relative: 39 %
Lymphs Abs: 1.7 10*3/uL (ref 0.7–4.0)
MCH: 29.8 pg (ref 26.0–34.0)
MCHC: 33 g/dL (ref 30.0–36.0)
MCV: 90.3 fL (ref 80.0–100.0)
Monocytes Absolute: 0.3 10*3/uL (ref 0.1–1.0)
Monocytes Relative: 8 %
Neutro Abs: 2.2 10*3/uL (ref 1.7–7.7)
Neutrophils Relative %: 50 %
Platelet Count: 228 10*3/uL (ref 150–400)
RBC: 4.13 MIL/uL (ref 3.87–5.11)
RDW: 12.9 % (ref 11.5–15.5)
WBC Count: 4.4 10*3/uL (ref 4.0–10.5)
nRBC: 0 % (ref 0.0–0.2)

## 2020-02-27 LAB — VITAMIN B12: Vitamin B-12: 369 pg/mL (ref 180–914)

## 2020-02-27 LAB — RETICULOCYTES
Immature Retic Fract: 8.2 % (ref 2.3–15.9)
RBC.: 4.09 MIL/uL (ref 3.87–5.11)
Retic Count, Absolute: 65 10*3/uL (ref 19.0–186.0)
Retic Ct Pct: 1.6 % (ref 0.4–3.1)

## 2020-02-27 NOTE — Progress Notes (Signed)
Hematology and Oncology Follow Up Visit  Rose Gonzalez [id number] 08-06-1976 44 y.o. 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 is here today for follow-up.  She is doing okay.  She has noted that she has had some bruising.  I am unsure as to why she would have the bruising.  She is not going through the change of life.  She has had no bleeding.  She has responded nicely to the iron.  Her hemoglobin when we first saw her was 8.7.  It is now up to over 12.  When we last saw her, her ferritin was 50 with iron saturation of 25%.  She is still working.  She works from home.  She works for [employer] [place].  She has had no problems with nausea or vomiting.  There is been no cough or shortness of breath.  She has had no change in bowel or bladder habits.  Overall, her performance status is ECOG 1.   Medications:  Allergies as of 02/27/2020   No Known Allergies     Medication List       Accurate as of Feb 27, 2020  2:30 PM. If you have any questions, ask your nurse or doctor.        multivitamin with minerals Tabs tablet Take 1 tablet by mouth every morning.       Allergies: No Known Allergies  Past Medical History, Surgical history, Social history, and Family History were reviewed and updated.  Review of Systems: Review of Systems  Constitutional: Negative.   HENT: Negative.   Eyes: Negative.   Respiratory: Negative.   Cardiovascular: Negative.   Gastrointestinal: Negative.   Genitourinary: Negative.   Musculoskeletal: Negative.   Skin: Negative.   Neurological: Negative.   Endo/Heme/Allergies: Bruises/bleeds easily.  Psychiatric/Behavioral: Negative.      Physical Exam:  vitals were not taken for this visit.   Wt Readings from Last 3 Encounters:  11/27/19 216 lb 1.9 oz (98 kg)  10/02/19 218 lb 12.8 oz (99.2 kg)    03/04/19 218 lb 4.1 oz (99 kg)   Her vital signs show temperature of 97.1.  Pulse 82.  Blood pressure was 120/68.  Weight is 214 pounds.  Physical Exam Vitals reviewed.  HENT:     Head: Normocephalic and atraumatic.  Eyes:     Pupils: Pupils are equal, round, and reactive to light.  Cardiovascular:     Rate and Rhythm: Normal rate and regular rhythm.     Heart sounds: Normal heart sounds.  Pulmonary:     Effort: Pulmonary effort is normal.     Breath sounds: Normal breath sounds.  Abdominal:     General: Bowel sounds are normal.     Palpations: Abdomen is soft.  Musculoskeletal:        General: No tenderness or deformity. Normal range of motion.     Cervical back: Normal range of motion.  Lymphadenopathy:     Cervical: No cervical adenopathy.  Skin:    General: Skin is warm and dry.     Findings: No erythema or rash.  Neurological:     Mental Status: She is alert and oriented to person, place, and time.  Psychiatric:        Behavior: Behavior normal.        Thought Content: Thought content normal.  Judgment: Judgment normal.      Lab Results  Component Value Date   WBC 4.4 02/27/2020   HGB 12.3 02/27/2020   HCT 37.3 02/27/2020   MCV 90.3 02/27/2020   PLT 228 02/27/2020   Lab Results  Component Value Date   FERRITIN 50 11/27/2019   IRON 72 11/27/2019   TIBC 290 11/27/2019   UIBC 218 11/27/2019   IRONPCTSAT 25 11/27/2019   Lab Results  Component Value Date   RETICCTPCT 1.6 02/27/2020   RBC 4.09 02/27/2020   RBC 4.13 02/27/2020   No results found for: KPAFRELGTCHN, LAMBDASER, KAPLAMBRATIO No results found for: IGGSERUM, IGA, IGMSERUM No results found for: Ronnald Ramp, A1GS, Nelida Meuse, SPEI   Chemistry      Component Value Date/Time   NA 138 06/24/2014 1015   K 3.8 06/24/2014 1015   CL 103 06/24/2014 1015   CO2 26 06/24/2014 1015   BUN 8 06/24/2014 1015   CREATININE 0.97 06/24/2014 1015   CREATININE 0.86  12/12/2013 1654      Component Value Date/Time   CALCIUM 8.9 06/24/2014 1015   ALKPHOS 40 06/24/2014 1015   AST 19 06/24/2014 1015   ALT 14 06/24/2014 1015   BILITOT 0.4 06/24/2014 1015       Impression and Plan: Rose Gonzalez is a very pleasant 44 yo African American female with iron deficiency anemia secondary to malabsorption after gastric sleeve procedure.   I would have to leave that her iron studies are going to be okay.  We will see what they are.  I am not sure why she has this bruising.  I cannot imagine that she has any kind of coagulopathy.  However, we will do a cursory evaluation.  We will have to have her come back for some lab work.  Overall, we will plan to get her back I think after the Labor Day holiday.  I cannot imagine that her iron level will drop that much.      Volanda Napoleon, MD 5/27/20212:30 PM

## 2020-02-28 LAB — IRON AND TIBC
Iron: 86 ug/dL (ref 41–142)
Saturation Ratios: 27 % (ref 21–57)
TIBC: 323 ug/dL (ref 236–444)
UIBC: 237 ug/dL (ref 120–384)

## 2020-02-28 LAB — FERRITIN: Ferritin: 9 ng/mL — ABNORMAL LOW (ref 11–307)

## 2020-03-05 ENCOUNTER — Telehealth: Payer: Self-pay | Admitting: Hematology & Oncology

## 2020-03-05 NOTE — Telephone Encounter (Signed)
Spoke with patient to inform her of needed a iv iron appt per Dr Myna Hidalgo result note. Patient declined scheduling appointment at this time to to cost and ineffective treatment.

## 2020-03-06 ENCOUNTER — Other Ambulatory Visit: Payer: Self-pay | Admitting: *Deleted

## 2020-03-06 DIAGNOSIS — T148XXA Other injury of unspecified body region, initial encounter: Secondary | ICD-10-CM

## 2020-03-09 ENCOUNTER — Other Ambulatory Visit: Payer: BC Managed Care – PPO

## 2020-06-11 DIAGNOSIS — R5383 Other fatigue: Secondary | ICD-10-CM | POA: Diagnosis not present

## 2020-06-11 DIAGNOSIS — E559 Vitamin D deficiency, unspecified: Secondary | ICD-10-CM | POA: Diagnosis not present

## 2020-06-11 DIAGNOSIS — E7800 Pure hypercholesterolemia, unspecified: Secondary | ICD-10-CM | POA: Diagnosis not present

## 2020-06-11 DIAGNOSIS — E349 Endocrine disorder, unspecified: Secondary | ICD-10-CM | POA: Diagnosis not present

## 2020-06-11 DIAGNOSIS — D539 Nutritional anemia, unspecified: Secondary | ICD-10-CM | POA: Diagnosis not present

## 2020-06-11 DIAGNOSIS — R0602 Shortness of breath: Secondary | ICD-10-CM | POA: Diagnosis not present

## 2020-06-11 DIAGNOSIS — Z20822 Contact with and (suspected) exposure to covid-19: Secondary | ICD-10-CM | POA: Diagnosis not present

## 2020-06-11 DIAGNOSIS — E8881 Metabolic syndrome: Secondary | ICD-10-CM | POA: Diagnosis not present

## 2020-06-11 DIAGNOSIS — Z79899 Other long term (current) drug therapy: Secondary | ICD-10-CM | POA: Diagnosis not present

## 2020-06-11 DIAGNOSIS — Z131 Encounter for screening for diabetes mellitus: Secondary | ICD-10-CM | POA: Diagnosis not present

## 2020-06-22 DIAGNOSIS — R0602 Shortness of breath: Secondary | ICD-10-CM | POA: Diagnosis not present

## 2020-06-22 DIAGNOSIS — R9431 Abnormal electrocardiogram [ECG] [EKG]: Secondary | ICD-10-CM | POA: Diagnosis not present

## 2020-06-23 DIAGNOSIS — R9431 Abnormal electrocardiogram [ECG] [EKG]: Secondary | ICD-10-CM | POA: Diagnosis not present

## 2020-06-29 ENCOUNTER — Inpatient Hospital Stay: Payer: BC Managed Care – PPO | Admitting: Hematology & Oncology

## 2020-06-29 ENCOUNTER — Inpatient Hospital Stay: Payer: BC Managed Care – PPO | Attending: Physician Assistant

## 2020-07-09 DIAGNOSIS — R635 Abnormal weight gain: Secondary | ICD-10-CM | POA: Diagnosis not present

## 2020-07-09 DIAGNOSIS — D539 Nutritional anemia, unspecified: Secondary | ICD-10-CM | POA: Diagnosis not present

## 2020-07-09 DIAGNOSIS — E559 Vitamin D deficiency, unspecified: Secondary | ICD-10-CM | POA: Diagnosis not present

## 2020-08-10 DIAGNOSIS — R635 Abnormal weight gain: Secondary | ICD-10-CM | POA: Diagnosis not present

## 2020-08-10 DIAGNOSIS — E559 Vitamin D deficiency, unspecified: Secondary | ICD-10-CM | POA: Diagnosis not present

## 2020-08-10 DIAGNOSIS — D539 Nutritional anemia, unspecified: Secondary | ICD-10-CM | POA: Diagnosis not present

## 2020-09-09 DIAGNOSIS — R635 Abnormal weight gain: Secondary | ICD-10-CM | POA: Diagnosis not present

## 2020-10-14 ENCOUNTER — Other Ambulatory Visit: Payer: Self-pay

## 2020-10-14 ENCOUNTER — Emergency Department (HOSPITAL_BASED_OUTPATIENT_CLINIC_OR_DEPARTMENT_OTHER): Payer: BC Managed Care – PPO

## 2020-10-14 ENCOUNTER — Encounter (HOSPITAL_BASED_OUTPATIENT_CLINIC_OR_DEPARTMENT_OTHER): Payer: Self-pay | Admitting: Emergency Medicine

## 2020-10-14 ENCOUNTER — Emergency Department (HOSPITAL_BASED_OUTPATIENT_CLINIC_OR_DEPARTMENT_OTHER)
Admission: EM | Admit: 2020-10-14 | Discharge: 2020-10-14 | Disposition: A | Discharge status: Home / Self Care | Payer: BC Managed Care – PPO | Attending: Emergency Medicine | Admitting: Emergency Medicine

## 2020-10-14 DIAGNOSIS — R519 Headache, unspecified: Secondary | ICD-10-CM | POA: Diagnosis not present

## 2020-10-14 DIAGNOSIS — R42 Dizziness and giddiness: Secondary | ICD-10-CM | POA: Diagnosis not present

## 2020-10-14 DIAGNOSIS — R55 Syncope and collapse: Secondary | ICD-10-CM | POA: Diagnosis not present

## 2020-10-14 DIAGNOSIS — I10 Essential (primary) hypertension: Secondary | ICD-10-CM | POA: Insufficient documentation

## 2020-10-14 LAB — BASIC METABOLIC PANEL
Anion gap: 8 (ref 5–15)
BUN: 7 mg/dL (ref 6–20)
CO2: 26 mmol/L (ref 22–32)
Calcium: 9.6 mg/dL (ref 8.9–10.3)
Chloride: 101 mmol/L (ref 98–111)
Creatinine, Ser: 0.8 mg/dL (ref 0.44–1.00)
GFR, Estimated: >60 mL/min (ref >60)
Glucose, Bld: 83 mg/dL (ref 70–99)
Potassium: 3.7 mmol/L (ref 3.5–5.1)
Sodium: 135 mmol/L (ref 135–145)

## 2020-10-14 LAB — CBC
HCT: 39.1 % (ref 36.0–46.0)
Hemoglobin: 13.1 g/dL (ref 12.0–15.0)
MCH: 30 pg (ref 26.0–34.0)
MCHC: 33.5 g/dL (ref 30.0–36.0)
MCV: 89.7 fL (ref 80.0–100.0)
Platelets: 212 10*3/uL (ref 150–400)
RBC: 4.36 MIL/uL (ref 3.87–5.11)
RDW: 12.7 % (ref 11.5–15.5)
WBC: 5.8 10*3/uL (ref 4.0–10.5)
nRBC: 0 % (ref 0.0–0.2)

## 2020-10-14 LAB — PREGNANCY, URINE: Preg Test, Ur: NEGATIVE

## 2020-10-14 MED ORDER — ACETAMINOPHEN 500 MG PO TABS
1000.0000 mg | ORAL_TABLET | Freq: Once | ORAL | Status: AC
  Administered 2020-10-14: 1000 mg via ORAL
  Filled 2020-10-14: qty 2

## 2020-10-14 NOTE — ED Triage Notes (Signed)
Reports she was working at home.  She laid her head on her desk because she felt hot all of a sudden.  Woke up on the floor banging the right side of her head on the floor with glasses off.  States I think I may have had a seizure.  No hx of seizures.

## 2020-10-14 NOTE — ED Provider Notes (Signed)
[facility] EMERGENCY DEPARTMENT Provider Note   CSN: [id number] Arrival date & time: 10/14/20  1024     History Chief Complaint  Patient presents with  . Loss of Consciousness    Rose Gonzalez is a 45 y.o. female.  HPI Patient is a 45 year old female with past medical history significant for anemia, HLD, HTN as well as MVP.  Patient states that at approximately 9:30 AM this morning she was working her desk when she felt somewhat hot, flushed, lightheaded put her head down on her desk and then woke up on the ground where she was bumping her head against the ground.  She states that she was immediately able to get up.  She did not feel confused she denies any urinary or bowel incontinence she denies any tongue biting or tongue pain.  She states she has a bit of a headache from when she hit her head on the ground.  She states that the headache is frontal achy and constant.  It has not really changed since this event.  She denies any vomiting but states that she did feel mildly nauseous after the event.  She states that prior to the event she did not have any chest pain, shortness of breath, vertigo or abdominal pain pelvic pain or vaginal bleeding.  She states she does not have any of the symptoms presently either.  She states that apart from feeling somewhat jittery she did not have any symptoms afterwards.  States she does not have any history of seizures.  Has not had any morning headaches or history of cancer.  No other associated symptoms.  No aggravating mitigating factors.    Past Medical History:  Diagnosis Date  . Anemia    with pregnancy   . Hyperlipidemia   . Hypertension    was on htn medss- pt took herself off of - none since 01/2014.      Patient Active Problem List   Diagnosis Date Noted  . Iron deficiency anemia 02/01/2017  . Malabsorption of iron 02/01/2017  . Morbid obesity (HCC) 12/12/2013  . Dyslipidemia 12/12/2013  . Pain in joint, lower leg  12/12/2013    Past Surgical History:  Procedure Laterality Date  . LAPAROSCOPIC GASTRIC SLEEVE RESECTION N/A 06/24/2014   Procedure: LAPAROSCOPIC GASTRIC SLEEVE RESECTION;  Surgeon: Glenna Fellows, MD;  Location: [facility];  Service: General;  Laterality: N/A;  . NO PAST SURGERIES       OB History   No obstetric history on file.     No family history on file.  Social History   Tobacco Use  . Smoking status: Never Smoker  . Smokeless tobacco: Never Used  Vaping Use  . Vaping Use: Never used  Substance Use Topics  . Alcohol use: Yes    Comment: occasional   . Drug use: No    Home Medications Prior to Admission medications   Medication Sig Start Date End Date Taking? Authorizing Provider  Multiple Vitamin (MULTIVITAMIN WITH MINERALS) TABS tablet Take 1 tablet by mouth every morning.    [provider]    Allergies    Patient has no known allergies.  Review of Systems   Review of Systems  Constitutional: Negative for chills and fever.  HENT: Negative for congestion.   Eyes: Negative for pain.  Respiratory: Negative for cough and shortness of breath.   Cardiovascular: Negative for chest pain and leg swelling.  Gastrointestinal: Positive for nausea. Negative for abdominal pain, diarrhea and vomiting.  Genitourinary:  Negative for dysuria.  Musculoskeletal: Negative for myalgias.  Skin: Negative for rash.  Neurological: Positive for syncope and headaches. Negative for dizziness.    Physical Exam Updated Vital Signs BP 121/80 (BP Location: Right Arm)   Pulse 77   Temp 98.7 F (37.1 C) (Oral)   Resp 18   Ht 6' (1.829 m)   Wt 99.8 kg   LMP 10/11/2020   SpO2 100%   BMI 29.84 kg/m   Physical Exam Vitals and nursing note reviewed.  Constitutional:      General: She is not in acute distress.    Appearance: She is obese.     Comments: Pleasant well-appearing 45 year old.  In no acute distress.  Sitting comfortably in bed.  Able answer questions  appropriately follow commands. No increased work of breathing. Speaking in full sentences.  HENT:     Head: Normocephalic and atraumatic.     Nose: Nose normal.     Mouth/Throat:     Mouth: Mucous membranes are moist.  Eyes:     General: No scleral icterus.    Extraocular Movements: Extraocular movements intact.     Pupils: Pupils are equal, round, and reactive to light.  Cardiovascular:     Rate and Rhythm: Normal rate and regular rhythm.     Pulses: Normal pulses.     Heart sounds: Normal heart sounds.     Comments: Regular rate and rhythm.  No murmurs rubs or gallops Pulmonary:     Effort: Pulmonary effort is normal. No respiratory distress.     Breath sounds: No wheezing.  Abdominal:     Palpations: Abdomen is soft.     Tenderness: There is no abdominal tenderness. There is no guarding or rebound.  Musculoskeletal:     Cervical back: Normal range of motion. No tenderness.     Right lower leg: No edema.     Left lower leg: No edema.     Comments: Very mild tenderness to palpation diffusely across the frontal forehead.  No bruising.  Skin:    General: Skin is warm and dry.     Capillary Refill: Capillary refill takes less than 2 seconds.  Neurological:     Mental Status: She is alert and oriented to person, place, and time. Mental status is at baseline.     Comments: Alert and oriented to self, place, time and event.   Speech is fluent, clear without dysarthria or dysphasia.   Strength 5/5 in upper/lower extremities  Sensation intact in upper/lower extremities   Normal finger-to-nose and feet tapping.  CN I not tested  CN II grossly intact visual fields bilaterally. Did not visualize posterior eye.   CN III, IV, VI PERRLA and EOMs intact bilaterally  CN V Intact sensation to sharp and light touch to the face  CN VII facial movements symmetric  CN VIII not tested  CN IX, X no uvula deviation, symmetric rise of soft palate  CN XI 5/5 SCM and trapezius strength  bilaterally  CN XII Midline tongue protrusion, symmetric L/R movements   Psychiatric:        Mood and Affect: Mood normal.        Behavior: Behavior normal.     ED Results / Procedures / Treatments   Labs (all labs ordered are listed, but only abnormal results are displayed) Labs Reviewed  BASIC METABOLIC PANEL  CBC  PREGNANCY, URINE    EKG EKG Interpretation  Date/Time:  Wednesday October 14 2020 11:47:22 EST Ventricular Rate:  28  PR Interval:    QRS Duration: 94 QT Interval:  414 QTC Calculation: 469 R Axis:   81 Text Interpretation: Sinus rhythm Low voltage, precordial leads No STMEI Confirmed by Alvester Chourifan, Matthew [phone]([id number]) on 10/14/2020 11:52:13 AM   Radiology CT Head Wo Contrast  Result Date: 10/14/2020 CLINICAL DATA:  Seizure. EXAM: CT HEAD WITHOUT CONTRAST TECHNIQUE: Contiguous axial images were obtained from the base of the skull through the vertex without intravenous contrast. COMPARISON:  None. FINDINGS: Brain: No evidence of acute infarction, hemorrhage, hydrocephalus, extra-axial collection or mass lesion/mass effect. Vascular: No hyperdense vessel or unexpected calcification. Skull: Normal. Negative for fracture or focal lesion. Sinuses/Orbits: No acute finding. Other: None. IMPRESSION: Normal head CT. Electronically Signed   By: Lupita RaiderJames  Green Jr M.D.   On: 10/14/2020 15:14   DG Chest Port 1 View  Result Date: 10/14/2020 CLINICAL DATA:  Chest pain, was working at home, felt hot and laid her head down on desk, woke up on the floor with the RIGHT side of head banging the floor EXAM: PORTABLE CHEST 1 VIEW COMPARISON:  Portable exam 1500 hours compared to 07/01/2014 FINDINGS: Normal heart size, mediastinal contours, and pulmonary vascularity. Lungs clear. No infiltrate, pleural effusion, or pneumothorax. Osseous structures unremarkable. IMPRESSION: Normal exam. Electronically Signed   By: Ulyses SouthwardMark  Boles M.D.   On: 10/14/2020 15:14    Procedures Procedures (including critical  care time)  Medications Ordered in ED Medications  acetaminophen (TYLENOL) tablet 1,000 mg (1,000 mg Oral Given 10/14/20 1437)    ED Course  I have reviewed the triage vital signs and the nursing notes.  Pertinent labs & imaging results that were available during my care of the patient were reviewed by me and considered in my medical decision making (see chart for details).    MDM Rules/Calculators/A&P                          Patient is a 45 year old female with history of MVP.  She is presented today after syncopal episode.  I do not think the patient had a seizure she does not have any tongue trauma did not lose control of bowel or bladder.  She also did not have any postictal period.  She has no history of seizures  I suspect patient has syncopal episode very likely vasovagal syncope however given her history of iron deficiency anemia will obtain CBC to make sure this is not due to severe anemia.  We will also obtain basic lab work to make sure the patient is not with any significant electrolyte derangements given the questionable seizure versus syncope question.  She has no new medications that she is using she denies any alcohol use or alcohol withdrawal or caffeine use or caffeine withdrawal no recreational drug use.  Your work-up today was unremarkable CT head is without any abnormalities I agree with radiology read.  Plain 1 view x-ray of chest is without any widening of mediastinum or pneumothorax.  Her pregnancy test is negative I doubt ectopic and her BMP and CBC were unremarkable.  The emergency differential for syncope includes ruptured ectopic, AAA, thoracic aortic dissection, PE, subarachnoid hemorrhage or other intracranial hemorrhage I have low suspicion for this.  Also low suspicion for arrhythmia.  Patient is EKG is nonischemic no significant abnormality.  Reviewed by Dr. Renaye Rakersrifan.  Likely vasovagal syncope however she will follow closely up with her primary care doctor  as well as a cardiologist.  She states that she was  seen by cardiologist approximately 1 month ago she does not recall where she went but she states that she will be able to follow-up with this provider.  She will ultimately need an echocardiogram.  I discussed this case my attending physician who is agreeable to plan.  Patient is understanding of plan.  Final Clinical Impression(s) / ED Diagnoses Final diagnoses:  Syncope, unspecified syncope type    Rx / DC Orders ED Discharge Orders    None       Gailen Shelter, [place] 10/15/20 1824    Terald Sleeper, MD 10/15/20 1840

## 2020-10-14 NOTE — Discharge Instructions (Signed)
Your work-up today was reassuring.  Please follow-up with your cardiologist that you have already established care with.  Please also follow-up with your primary care doctor. If you have new or concerning symptoms may arise return to the emergency department.  Please been plenty of water.  Dehydration can certainly predispose you to passing out.

## 2020-10-14 NOTE — ED Notes (Signed)
Patient ambulated to bathroom, gait steady, denies feeling weak or dizzy

## 2020-11-11 ENCOUNTER — Other Ambulatory Visit: Payer: Self-pay | Admitting: Physician Assistant

## 2020-11-11 DIAGNOSIS — Z1231 Encounter for screening mammogram for malignant neoplasm of breast: Secondary | ICD-10-CM

## 2020-12-30 ENCOUNTER — Ambulatory Visit
Admission: RE | Admit: 2020-12-30 | Discharge: 2020-12-30 | Disposition: A | Discharge status: Home / Self Care | Payer: BC Managed Care – PPO | Source: Ambulatory Visit | Attending: Physician Assistant | Admitting: Physician Assistant

## 2020-12-30 ENCOUNTER — Other Ambulatory Visit: Payer: Self-pay

## 2020-12-30 DIAGNOSIS — Z1231 Encounter for screening mammogram for malignant neoplasm of breast: Secondary | ICD-10-CM

## 2021-12-09 DIAGNOSIS — Z9884 Bariatric surgery status: Secondary | ICD-10-CM

## 2021-12-09 DIAGNOSIS — D510 Vitamin B12 deficiency anemia due to intrinsic factor deficiency: Secondary | ICD-10-CM | POA: Insufficient documentation

## 2021-12-09 DIAGNOSIS — I10 Essential (primary) hypertension: Secondary | ICD-10-CM | POA: Insufficient documentation

## 2021-12-09 DIAGNOSIS — N920 Excessive and frequent menstruation with regular cycle: Secondary | ICD-10-CM | POA: Insufficient documentation

## 2021-12-09 DIAGNOSIS — E785 Hyperlipidemia, unspecified: Secondary | ICD-10-CM | POA: Insufficient documentation

## 2021-12-09 DIAGNOSIS — D649 Anemia, unspecified: Secondary | ICD-10-CM | POA: Insufficient documentation

## 2021-12-09 HISTORY — DX: Bariatric surgery status: Z98.84

## 2021-12-13 ENCOUNTER — Other Ambulatory Visit: Payer: Self-pay

## 2021-12-13 ENCOUNTER — Ambulatory Visit: Payer: BC Managed Care – PPO | Admitting: Cardiology

## 2021-12-13 ENCOUNTER — Encounter: Payer: Self-pay | Admitting: Cardiology

## 2021-12-13 VITALS — BP 122/70 | HR 90 | Ht 72.0 in | Wt 214.0 lb

## 2021-12-13 DIAGNOSIS — R011 Cardiac murmur, unspecified: Secondary | ICD-10-CM

## 2021-12-13 DIAGNOSIS — E782 Mixed hyperlipidemia: Secondary | ICD-10-CM | POA: Insufficient documentation

## 2021-12-13 DIAGNOSIS — Z9884 Bariatric surgery status: Secondary | ICD-10-CM

## 2021-12-13 NOTE — Patient Instructions (Signed)
Medication Instructions:  Your physician recommends that you continue on your current medications as directed. Please refer to the Current Medication list given to you today.  *If you need a refill on your cardiac medications before your next appointment, please call your pharmacy*   Lab Work: None ordered If you have labs (blood work) drawn today and your tests are completely normal, you will receive your results only by: MyChart Message (if you have MyChart) OR A paper copy in the mail If you have any lab test that is abnormal or we need to change your treatment, we will call you to review the results.   Testing/Procedures: Your physician has requested that you have an echocardiogram. Echocardiography is a painless test that uses sound waves to create images of your heart. It provides your doctor with information about the size and shape of your heart and how well your hearts chambers and valves are working. This procedure takes approximately one hour. There are no restrictions for this procedure.  We will order CT coronary calcium score. It will cost $99.00 and is not covered by insurance.  Please call [phone] to schedule.   [facility]  [address]  [place], [place] [address]  Follow-Up: At [facility], you and your health needs are our priority.  As part of our continuing mission to provide you with exceptional heart care, we have created designated Provider Care Teams.  These Care Teams include your primary Cardiologist (physician) and Advanced Practice Providers (APPs -  Physician Assistants and Nurse Practitioners) who all work together to provide you with the care you need, when you need it.  We recommend signing up for the patient portal called "MyChart".  Sign up information is provided on this After Visit Summary.  MyChart is used to connect with patients for Virtual Visits (Telemedicine).  Patients are able to view lab/test results, encounter notes,  upcoming appointments, etc.  Non-urgent messages can be sent to your provider as well.   To learn more about what you can do with MyChart, go to [email or web address].    Your next appointment:   9 month(s)  The format for your next appointment:   In Person  Provider:   Belva Cromeajan Revankar, MD   Other Instructions Echocardiogram An echocardiogram is a test that uses sound waves (ultrasound) to produce images of the heart. Images from an echocardiogram can provide important information about: Heart size and shape. The size and thickness and movement of your heart's walls. Heart muscle function and strength. Heart valve function or if you have stenosis. Stenosis is when the heart valves are too narrow. If blood is flowing backward through the heart valves (regurgitation). A tumor or infectious growth around the heart valves. Areas of heart muscle that are not working well because of poor blood flow or injury from a heart attack. Aneurysm detection. An aneurysm is a weak or damaged part of an artery wall. The wall bulges out from the normal force of blood pumping through the body. Tell a health care provider about: Any allergies you have. All medicines you are taking, including vitamins, herbs, eye drops, creams, and over-the-counter medicines. Any blood disorders you have. Any surgeries you have had. Any medical conditions you have. Whether you are pregnant or may be pregnant. What are the risks? Generally, this is a safe test. However, problems may occur, including an allergic reaction to dye (contrast) that may be used during the test. What happens before the test? No specific  preparation is needed. You may eat and drink normally. What happens during the test? You will take off your clothes from the waist up and put on a hospital gown. Electrodes or electrocardiogram (ECG)patches may be placed on your chest. The electrodes or patches are then connected to a device that monitors  your heart rate and rhythm. You will lie down on a table for an ultrasound exam. A gel will be applied to your chest to help sound waves pass through your skin. A handheld device, called a transducer, will be pressed against your chest and moved over your heart. The transducer produces sound waves that travel to your heart and bounce back (or "echo" back) to the transducer. These sound waves will be captured in real-time and changed into images of your heart that can be viewed on a video monitor. The images will be recorded on a computer and reviewed by your health care provider. You may be asked to change positions or hold your breath for a short time. This makes it easier to get different views or better views of your heart. In some cases, you may receive contrast through an IV in one of your veins. This can improve the quality of the pictures from your heart. The procedure may vary among health care providers and hospitals.   What can I expect after the test? You may return to your normal, everyday life, including diet, activities, and medicines, unless your health care provider tells you not to do that. Follow these instructions at home: It is up to you to get the results of your test. Ask your health care provider, or the department that is doing the test, when your results will be ready. Keep all follow-up visits. This is important. Summary An echocardiogram is a test that uses sound waves (ultrasound) to produce images of the heart. Images from an echocardiogram can provide important information about the size and shape of your heart, heart muscle function, heart valve function, and other possible heart problems. You do not need to do anything to prepare before this test. You may eat and drink normally. After the echocardiogram is completed, you may return to your normal, everyday life, unless your health care provider tells you not to do that. This information is not intended to replace  advice given to you by your health care provider. Make sure you discuss any questions you have with your health care provider. Document Revised: 05/12/2020 Document Reviewed: 05/12/2020 Elsevier Patient Education  2021 [employer] Inc[employer]  Coronary Calcium Scan A coronary calcium scan is an imaging test used to look for deposits of plaque in the inner lining of the blood vessels of the heart (coronary arteries). Plaque is made up of calcium, protein, and fatty substances. These deposits of plaque can partly clog and narrow the coronary arteries without producing any symptoms or warning signs. This puts a person at risk for a heart attack. This test is recommended for people who are at moderate risk for heart disease. The test can find plaque deposits before symptoms develop. Tell a health care provider about: Any allergies you have. All medicines you are taking, including vitamins, herbs, eye drops, creams, and over-the-counter medicines. Any problems you or family members have had with anesthetic medicines. Any blood disorders you have. Any surgeries you have had. Any medical conditions you have. Whether you are pregnant or may be pregnant. What are the risks? Generally, this is a safe procedure. However, problems may occur, including: Harm to a pregnant  woman and her unborn baby. This test involves the use of radiation. Radiation exposure can be dangerous to a pregnant woman and her unborn baby. If you are pregnant or think you may be pregnant, you should not have this procedure done. Slight increase in the risk of cancer. This is because of the radiation involved in the test. What happens before the procedure? Ask your health care provider for any specific instructions on how to prepare for this procedure. You may be asked to avoid products that contain caffeine, tobacco, or nicotine for 4 hours before the procedure. What happens during the procedure?  You will undress and remove any jewelry  from your neck or chest. You will put on a hospital gown. Sticky electrodes will be placed on your chest. The electrodes will be connected to an electrocardiogram (ECG) machine to record a tracing of the electrical activity of your heart. You will lie down on a curved bed that is attached to the CT scanner. You may be given medicine to slow down your heart rate so that clear pictures can be created. You will be moved into the CT scanner, and the CT scanner will take pictures of your heart. During this time, you will be asked to lie still and hold your breath for 2-3 seconds at a time while each picture of your heart is being taken. The procedure may vary among health care providers and hospitals. What happens after the procedure? You can get dressed. You can return to your normal activities. It is up to you to get the results of your procedure. Ask your health care provider, or the department that is doing the procedure, when your results will be ready. Summary A coronary calcium scan is an imaging test used to look for deposits of plaque in the inner lining of the blood vessels of the heart (coronary arteries). Plaque is made up of calcium, protein, and fatty substances. Generally, this is a safe procedure. Tell your health care provider if you are pregnant or may be pregnant. Ask your health care provider for any specific instructions on how to prepare for this procedure. A CT scanner will take pictures of your heart. You can return to your normal activities after the scan is done. This information is not intended to replace advice given to you by your health care provider. Make sure you discuss any questions you have with your health care provider. Document Revised: 04/04/2019 Document Reviewed: 04/09/2019 Elsevier Patient Education  2022 [employer].

## 2021-12-13 NOTE — Progress Notes (Signed)
?Cardiology Office Note:   ? ?Date:  12/13/2021  ? ?ID:  [name], DOB [birth year], MRN [id number] ? ?PCP:  Milus Height, PA  ?Cardiologist:  Garwin Brothers, MD  ? ?Referring MD: Milus Height, PA  ? ? ?ASSESSMENT:   ? ?1. Murmur, cardiac   ?2. Moderate mixed hyperlipidemia not requiring statin therapy   ?3. Mixed dyslipidemia   ?4. History of bariatric surgery   ?5. Cardiac murmur   ? ?PLAN:   ? ?In order of problems listed above: ? ?Primary prevention stressed with the patient.  Importance of compliance with diet medication stressed and she vocalized understanding.  She exercises well.  I told her to walk at least half an hour a day 5 days a week and she promises to do so. ?Mixed dyslipidemia: Lipids are markedly elevated.  LDL is greater than 180.  I will try to get copy of all records from primary care.  I discussed diet.  She understands. ?Cardiac murmur: Echocardiogram will be done to assess murmur heard on auscultation. ?Coronary risk stratification: In view of risk factors mentioned above and family history of coronary artery disease I suggested calcium score and she is agreeable.  Further recommendations will be made based on the findings of the test. ?Overweight: Weight reduction stressed.  Diet emphasized and she promises to do better. ?Patient will be seen in follow-up appointment in 9 months or earlier if the patient has any concerns ? ? ? ?Medication Adjustments/Labs and Tests Ordered: ?Current medicines are reviewed at length with the patient today.  Concerns regarding medicines are outlined above.  ?Orders Placed This Encounter  ?Procedures  ? CT CARDIAC SCORING  ? EKG 12-Lead  ? ECHOCARDIOGRAM COMPLETE  ? ?No orders of the defined types were placed in this encounter. ? ? ? ?History of Present Illness:   ? ?Rose Gonzalez is a 46 y.o. female who is being seen today for the evaluation of cardiac murmur and elevated lipids at the request of [place], [place], [place].  Patient is a pleasant  46 year old female.  She has past medical history of mixed dyslipidemia.  She has undergone bariatric surgery in the past.  She mentions to me that she is an active lady.  She goes on walks on trails for half an hour to 40 minutes at times.  She denies any chest pain orthopnea or PND.  At the time of my evaluation, the patient is alert awake oriented and in no distress. ? ?Past Medical History:  ?Diagnosis Date  ? Anemia   ? with pregnancy   ? Dyslipidemia 12/12/2013  ? History of bariatric surgery 12/09/2021  ? Hyperlipidemia   ? Hypertension   ? was on htn medss- pt took herself off of - none since 01/2014.    ? Iron deficiency anemia   ? Malabsorption of iron 02/01/2017  ? Menorrhagia with regular cycle   ? Morbid obesity (HCC) 12/12/2013  ? Pain in joint, lower leg 12/12/2013  ? Pernicious anemia   ? ? ?Past Surgical History:  ?Procedure Laterality Date  ? LAPAROSCOPIC GASTRIC SLEEVE RESECTION N/A 06/24/2014  ? Procedure: LAPAROSCOPIC GASTRIC SLEEVE RESECTION;  Surgeon: Glenna Fellows, MD;  Location: [facility];  Service: General;  Laterality: N/A;  ? NO PAST SURGERIES    ? ? ?Current Medications: ?Current Meds  ?Medication Sig  ? Multiple Vitamin (MULTIVITAMIN WITH MINERALS) TABS tablet Take 1 tablet by mouth every morning.  ?  ? ?Allergies:   Patient has no known allergies.  ? ?  Social History  ? ?Socioeconomic History  ? Marital status: Single  ?  Spouse name: Not on file  ? Number of children: Not on file  ? Years of education: Not on file  ? Highest education level: Not on file  ?Occupational History  ? Not on file  ?Tobacco Use  ? Smoking status: Never  ? Smokeless tobacco: Never  ?Vaping Use  ? Vaping Use: Never used  ?Substance and Sexual Activity  ? Alcohol use: Yes  ?  Comment: occasional   ? Drug use: No  ? Sexual activity: Not on file  ?Other Topics Concern  ? Not on file  ?Social History Narrative  ? Not on file  ? ?Social Determinants of Health  ? ?Financial Resource Strain: Not on file  ?Food Insecurity: Not  on file  ?Transportation Needs: Not on file  ?Physical Activity: Not on file  ?Stress: Not on file  ?Social Connections: Not on file  ?  ? ?Family History: ?The patient's family history includes Bipolar disorder in her mother; Heart attack in her father. ? ?ROS:   ?Please see the history of present illness.    ?All other systems reviewed and are negative. ? ?EKGs/Labs/Other Studies Reviewed:   ? ?The following studies were reviewed today: ?EKG reveals sinus rhythm and nonspecific ST-T changes ? ? ?Recent Labs: ?No results found for requested labs within last 8760 hours.  ?Recent Lipid Panel ?   ?Component Value Date/Time  ? CHOL 185 12/12/2013 1654  ? TRIG 100 12/12/2013 1654  ? HDL 65 12/12/2013 1654  ? CHOLHDL 2.8 12/12/2013 1654  ? VLDL 20 12/12/2013 1654  ? LDLCALC 100 (H) 12/12/2013 1654  ? ? ?Physical Exam:   ? ?VS:  BP 122/70   Pulse 90   Ht 6' (1.829 m)   Wt 214 lb (97.1 kg)   SpO2 99%   BMI 29.02 kg/m?    ? ?Wt Readings from Last 3 Encounters:  ?12/13/21 214 lb (97.1 kg)  ?10/14/20 220 lb (99.8 kg)  ?02/27/20 214 lb (97.1 kg)  ?  ? ?GEN: Patient is in no acute distress ?HEENT: Normal ?NECK: No JVD; No carotid bruits ?LYMPHATICS: No lymphadenopathy ?CARDIAC: S1 S2 regular, 2/6 systolic murmur at the apex. ?RESPIRATORY:  Clear to auscultation without rales, wheezing or rhonchi  ?ABDOMEN: Soft, non-tender, non-distended ?MUSCULOSKELETAL:  No edema; No deformity  ?SKIN: Warm and dry ?NEUROLOGIC:  Alert and oriented x 3 ?PSYCHIATRIC:  Normal affect  ? ? ?Signed, ?Garwin Brothers, MD  ?12/13/2021 2:15 PM    ?[facility]   ?

## 2021-12-29 ENCOUNTER — Ambulatory Visit (HOSPITAL_BASED_OUTPATIENT_CLINIC_OR_DEPARTMENT_OTHER): Admission: RE | Admit: 2021-12-29 | Payer: BC Managed Care – PPO | Source: Ambulatory Visit

## 2022-01-26 ENCOUNTER — Ambulatory Visit (INDEPENDENT_AMBULATORY_CARE_PROVIDER_SITE_OTHER)
Admission: RE | Admit: 2022-01-26 | Discharge: 2022-01-26 | Disposition: A | Discharge status: Home / Self Care | Payer: Self-pay | Source: Ambulatory Visit | Attending: Cardiology | Admitting: Cardiology

## 2022-01-26 DIAGNOSIS — E782 Mixed hyperlipidemia: Secondary | ICD-10-CM

## 2022-10-26 ENCOUNTER — Ambulatory Visit: Payer: BC Managed Care – PPO | Admitting: Cardiology

## 2024-02-21 ENCOUNTER — Other Ambulatory Visit (HOSPITAL_BASED_OUTPATIENT_CLINIC_OR_DEPARTMENT_OTHER): Payer: Self-pay

## 2024-02-22 ENCOUNTER — Other Ambulatory Visit (HOSPITAL_BASED_OUTPATIENT_CLINIC_OR_DEPARTMENT_OTHER): Payer: Self-pay

## 2024-02-28 ENCOUNTER — Encounter: Payer: Self-pay | Admitting: Family

## 2024-02-28 ENCOUNTER — Other Ambulatory Visit (HOSPITAL_BASED_OUTPATIENT_CLINIC_OR_DEPARTMENT_OTHER): Payer: Self-pay

## 2024-03-04 ENCOUNTER — Encounter: Payer: Self-pay | Admitting: Family

## 2024-03-04 ENCOUNTER — Other Ambulatory Visit (HOSPITAL_BASED_OUTPATIENT_CLINIC_OR_DEPARTMENT_OTHER): Payer: Self-pay

## 2024-03-04 MED ORDER — ZEPBOUND 2.5 MG/0.5ML ~~LOC~~ SOAJ
2.5000 mg | SUBCUTANEOUS | 0 refills | Status: AC
  Filled 2024-03-04 (×2): qty 2, 28d supply, fill #0

## 2024-03-05 ENCOUNTER — Other Ambulatory Visit (HOSPITAL_BASED_OUTPATIENT_CLINIC_OR_DEPARTMENT_OTHER): Payer: Self-pay

## 2024-03-06 ENCOUNTER — Other Ambulatory Visit (HOSPITAL_BASED_OUTPATIENT_CLINIC_OR_DEPARTMENT_OTHER): Payer: Self-pay

## 2024-03-07 ENCOUNTER — Other Ambulatory Visit (HOSPITAL_BASED_OUTPATIENT_CLINIC_OR_DEPARTMENT_OTHER): Payer: Self-pay

## 2024-03-08 ENCOUNTER — Other Ambulatory Visit (HOSPITAL_BASED_OUTPATIENT_CLINIC_OR_DEPARTMENT_OTHER): Payer: Self-pay

## 2024-03-11 ENCOUNTER — Other Ambulatory Visit (HOSPITAL_BASED_OUTPATIENT_CLINIC_OR_DEPARTMENT_OTHER): Payer: Self-pay

## 2024-03-12 ENCOUNTER — Other Ambulatory Visit (HOSPITAL_BASED_OUTPATIENT_CLINIC_OR_DEPARTMENT_OTHER): Payer: Self-pay

## 2024-03-13 ENCOUNTER — Other Ambulatory Visit (HOSPITAL_BASED_OUTPATIENT_CLINIC_OR_DEPARTMENT_OTHER): Payer: Self-pay

## 2024-03-14 ENCOUNTER — Other Ambulatory Visit (HOSPITAL_BASED_OUTPATIENT_CLINIC_OR_DEPARTMENT_OTHER): Payer: Self-pay

## 2024-03-15 ENCOUNTER — Other Ambulatory Visit (HOSPITAL_BASED_OUTPATIENT_CLINIC_OR_DEPARTMENT_OTHER): Payer: Self-pay

## 2024-03-18 ENCOUNTER — Other Ambulatory Visit (HOSPITAL_BASED_OUTPATIENT_CLINIC_OR_DEPARTMENT_OTHER): Payer: Self-pay

## 2024-03-19 ENCOUNTER — Other Ambulatory Visit (HOSPITAL_BASED_OUTPATIENT_CLINIC_OR_DEPARTMENT_OTHER): Payer: Self-pay

## 2024-03-20 ENCOUNTER — Other Ambulatory Visit (HOSPITAL_BASED_OUTPATIENT_CLINIC_OR_DEPARTMENT_OTHER): Payer: Self-pay

## 2024-03-21 ENCOUNTER — Other Ambulatory Visit (HOSPITAL_BASED_OUTPATIENT_CLINIC_OR_DEPARTMENT_OTHER): Payer: Self-pay

## 2024-03-22 ENCOUNTER — Other Ambulatory Visit (HOSPITAL_BASED_OUTPATIENT_CLINIC_OR_DEPARTMENT_OTHER): Payer: Self-pay

## 2024-03-25 ENCOUNTER — Other Ambulatory Visit (HOSPITAL_BASED_OUTPATIENT_CLINIC_OR_DEPARTMENT_OTHER): Payer: Self-pay

## 2024-03-26 ENCOUNTER — Other Ambulatory Visit (HOSPITAL_BASED_OUTPATIENT_CLINIC_OR_DEPARTMENT_OTHER): Payer: Self-pay

## 2024-03-28 ENCOUNTER — Other Ambulatory Visit (HOSPITAL_BASED_OUTPATIENT_CLINIC_OR_DEPARTMENT_OTHER): Payer: Self-pay

## 2024-03-29 ENCOUNTER — Other Ambulatory Visit (HOSPITAL_BASED_OUTPATIENT_CLINIC_OR_DEPARTMENT_OTHER): Payer: Self-pay

## 2024-04-01 ENCOUNTER — Other Ambulatory Visit (HOSPITAL_BASED_OUTPATIENT_CLINIC_OR_DEPARTMENT_OTHER): Payer: Self-pay

## 2024-04-02 ENCOUNTER — Other Ambulatory Visit (HOSPITAL_BASED_OUTPATIENT_CLINIC_OR_DEPARTMENT_OTHER): Payer: Self-pay

## 2024-04-03 ENCOUNTER — Other Ambulatory Visit (HOSPITAL_BASED_OUTPATIENT_CLINIC_OR_DEPARTMENT_OTHER): Payer: Self-pay

## 2024-04-04 ENCOUNTER — Other Ambulatory Visit (HOSPITAL_BASED_OUTPATIENT_CLINIC_OR_DEPARTMENT_OTHER): Payer: Self-pay

## 2024-04-08 ENCOUNTER — Other Ambulatory Visit (HOSPITAL_BASED_OUTPATIENT_CLINIC_OR_DEPARTMENT_OTHER): Payer: Self-pay

## 2024-04-18 ENCOUNTER — Other Ambulatory Visit: Payer: Self-pay | Admitting: Psychiatry

## 2024-04-18 DIAGNOSIS — Z1231 Encounter for screening mammogram for malignant neoplasm of breast: Secondary | ICD-10-CM

## 2024-05-03 ENCOUNTER — Ambulatory Visit
Admission: RE | Admit: 2024-05-03 | Discharge: 2024-05-03 | Disposition: A | Discharge status: Home / Self Care | Source: Ambulatory Visit | Attending: Psychiatry | Admitting: Psychiatry

## 2024-05-03 DIAGNOSIS — Z1231 Encounter for screening mammogram for malignant neoplasm of breast: Secondary | ICD-10-CM

## 2024-09-16 ENCOUNTER — Inpatient Hospital Stay: Attending: Medical Oncology

## 2024-09-16 ENCOUNTER — Inpatient Hospital Stay: Admitting: Medical Oncology
